# Patient Record
Sex: Female | Born: 2000 | Race: Black or African American | Hispanic: No | Marital: Single | Smoking: Never smoker
Health system: Southern US, Community
[De-identification: ages and names within clinical notes are randomized; demographics above are authoritative.]

## PROBLEM LIST (undated history)

## (undated) DIAGNOSIS — F419 Anxiety disorder, unspecified: Secondary | ICD-10-CM

## (undated) DIAGNOSIS — D573 Sickle-cell trait: Secondary | ICD-10-CM

## (undated) HISTORY — DX: Anxiety disorder, unspecified: F41.9

## (undated) HISTORY — DX: Sickle-cell trait: D57.3

## (undated) HISTORY — PX: CYST REMOVAL NECK: SHX6281

---

## 2020-03-16 ENCOUNTER — Emergency Department (HOSPITAL_COMMUNITY)
Admission: EM | Admit: 2020-03-16 | Discharge: 2020-03-16 | Disposition: A | Discharge status: Home / Self Care | Payer: Medicaid Other | Attending: Emergency Medicine | Admitting: Emergency Medicine

## 2020-03-16 ENCOUNTER — Encounter (HOSPITAL_COMMUNITY): Payer: Self-pay | Admitting: Emergency Medicine

## 2020-03-16 ENCOUNTER — Other Ambulatory Visit: Payer: Self-pay

## 2020-03-16 DIAGNOSIS — H9201 Otalgia, right ear: Secondary | ICD-10-CM | POA: Insufficient documentation

## 2020-03-16 DIAGNOSIS — Z5321 Procedure and treatment not carried out due to patient leaving prior to being seen by health care provider: Secondary | ICD-10-CM | POA: Diagnosis not present

## 2020-03-16 NOTE — ED Triage Notes (Signed)
C/o R ear throbbing since Tuesday.

## 2020-07-15 ENCOUNTER — Emergency Department (HOSPITAL_COMMUNITY)
Admission: EM | Admit: 2020-07-15 | Discharge: 2020-07-15 | Disposition: A | Discharge status: Home / Self Care | Payer: Medicaid Other | Attending: Emergency Medicine | Admitting: Emergency Medicine

## 2020-07-15 ENCOUNTER — Other Ambulatory Visit: Payer: Self-pay

## 2020-07-15 DIAGNOSIS — F410 Panic disorder [episodic paroxysmal anxiety] without agoraphobia: Secondary | ICD-10-CM | POA: Insufficient documentation

## 2020-07-15 DIAGNOSIS — F329 Major depressive disorder, single episode, unspecified: Secondary | ICD-10-CM | POA: Insufficient documentation

## 2020-07-15 DIAGNOSIS — R0789 Other chest pain: Secondary | ICD-10-CM | POA: Diagnosis not present

## 2020-07-15 DIAGNOSIS — R0602 Shortness of breath: Secondary | ICD-10-CM | POA: Diagnosis present

## 2020-07-15 MED ORDER — IBUPROFEN 200 MG PO TABS
600.0000 mg | ORAL_TABLET | Freq: Once | ORAL | Status: AC
  Administered 2020-07-15: 600 mg via ORAL
  Filled 2020-07-15: qty 3

## 2020-07-15 NOTE — Discharge Instructions (Signed)
Thank you for allowing me to care for you today in the Emergency Department.   Your symptoms today were consistent with a panic attack.  You can follow-up with [facility] if your symptoms return.  You can take 600 mg of ibuprofen once every 6 hours as needed for headache.  Make sure that you are eating and drinking while you are studying for exams to prevent dehydration.  Return to the emergency department if you develop respiratory distress, uncontrollable vomiting, if you pass out, develop new numbness or weakness, chest pain, or other new, concerning symptoms.

## 2020-07-15 NOTE — ED Provider Notes (Signed)
[facility]-EMERGENCY DEPT Provider Note   CSN: [id number] Arrival date & time: 07/15/20  0017     History Chief Complaint  Patient presents with  . Panic Attack    Tina Spence is a 20 y.o. female with a history of depression, anxiety, and ADHD who presents the emergency department by EMS from [facility] with a chief complaint of shortness of breath.  The patient reports that she was taking an online test from one of her classes when she suddenly developed shortness of breath and chest tightness.  She states that she could not catch her breath and began hyperventilating and crying.  Symptoms began less than 30 minutes prior to arrival.  EMS also noted that the patient was screaming when they arrived.  She was given 5 mg of IM midazolam in route and symptoms resolved.  Patient is concerned that she was having a panic attack.  She stated that prior to onset of symptoms that she was feeling in her baseline, but does note that she has been having headache for the last 2 days.  States that she is too nervous to try anything for her headache because her aunt died from pain medication.  She reports that she was diagnosed with depression, anxiety, and ADHD a little over a year ago and was taking medication, but states that she stopped the medication 1 month ago because she did not like the way it made her feel.  She denies cough, loss of sense of taste or smell, neck pain or stiffness, numbness, weakness, seizure-like activity, diaphoresis, dizziness, lightheadedness, vomiting, abdominal pain, diarrhea, constipation, nausea, visual changes, chest pain, or shortness of breath at this time.  No SI, HI, auditory visual hallucinations.  The history is provided by the patient and medical records. No language interpreter was used.       No past medical history on file.  There are no problems to display for this patient.   No past surgical history on file.   OB History   No  obstetric history on file.     No family history on file.  Social History   Tobacco Use  . Smoking status: Never Smoker  . Smokeless tobacco: Never Used  Substance Use Topics  . Alcohol use: Not Currently  . Drug use: Not Currently    Home Medications Prior to Admission medications   Medication Sig Start Date End Date Taking? Authorizing Provider  SIMPESSE 0.15-0.03 &0.01 MG tablet Take 1 tablet by mouth daily. 06/13/20  Yes [provider]    Allergies    Patient has no known allergies.  Review of Systems   Review of Systems  Constitutional: Negative for activity change, chills, diaphoresis and fever.       Crying  HENT: Negative for congestion, sore throat and trouble swallowing.   Respiratory: Positive for chest tightness and shortness of breath. Negative for apnea, cough, choking and wheezing.   Cardiovascular: Negative for chest pain, palpitations and leg swelling.  Gastrointestinal: Negative for abdominal pain, diarrhea, nausea and vomiting.  Genitourinary: Negative for dysuria, urgency, vaginal bleeding, vaginal discharge and vaginal pain.  Musculoskeletal: Negative for back pain, myalgias, neck pain and neck stiffness.  Skin: Negative for rash.  Allergic/Immunologic: Negative for immunocompromised state.  Neurological: Negative for dizziness, seizures, syncope, speech difficulty, weakness, numbness and headaches.  Psychiatric/Behavioral: Negative for confusion.    Physical Exam Updated Vital Signs BP 134/87 (BP Location: Right Arm)   Pulse 84   Temp 98.7 F (37.1  C) (Oral)   Ht 5\' 5"  (1.651 m)   Wt 81.6 kg   SpO2 98%   BMI 29.95 kg/m   Physical Exam Vitals and nursing note reviewed.  Constitutional:      General: She is not in acute distress.    Appearance: She is not ill-appearing, toxic-appearing or diaphoretic.     Comments: Well-appearing.  No acute distress.  HENT:     Head: Normocephalic.  Eyes:     Conjunctiva/sclera: Conjunctivae  normal.  Cardiovascular:     Rate and Rhythm: Normal rate and regular rhythm.     Heart sounds: No murmur heard. No friction rub. No gallop.   Pulmonary:     Effort: Pulmonary effort is normal. No respiratory distress.     Breath sounds: No stridor. No wheezing, rhonchi or rales.  Chest:     Chest wall: No tenderness.  Abdominal:     General: There is no distension.     Palpations: Abdomen is soft. There is no mass.     Tenderness: There is no abdominal tenderness. There is no right CVA tenderness, left CVA tenderness, guarding or rebound.     Hernia: No hernia is present.  Musculoskeletal:     Cervical back: Neck supple.     Right lower leg: No edema.     Left lower leg: No edema.  Skin:    General: Skin is warm.     Capillary Refill: Capillary refill takes less than 2 seconds.     Findings: No rash.  Neurological:     General: No focal deficit present.     Mental Status: She is alert.  Psychiatric:        Attention and Perception: She does not perceive auditory or visual hallucinations.        Mood and Affect: Mood is anxious.        Behavior: Behavior normal.        Thought Content: Thought content does not include homicidal or suicidal ideation. Thought content does not include homicidal or suicidal plan.     ED Results / Procedures / Treatments   Labs (all labs ordered are listed, but only abnormal results are displayed) Labs Reviewed - No data to display  EKG None  Radiology No results found.  Procedures Procedures   Medications Ordered in ED Medications  ibuprofen (ADVIL) tablet 600 mg (600 mg Oral Given 07/15/20 0159)    ED Course  I have reviewed the triage vital signs and the nursing notes.  Pertinent labs & imaging results that were available during my care of the patient were reviewed by me and considered in my medical decision making (see chart for details).    MDM Rules/Calculators/A&P                          20 year old female with history  of anxiety, depression, and ADHD who presents the emergency department by EMS from her dorm room with an episode of chest pain, shortness of breath, hyperventilating, and crying that began less than 30 minutes prior to arrival while the patient was taking a test.  She was given midazolam in route and symptoms have since resolved.  Her only complaint at this time is a headache, which resolved after she was given ibuprofen in the emergency department.  Patient was observed for more than 3 hours with no recurrence of symptoms.  She has remained hemodynamically stable in no acute distress.  EKG with normal  sinus rhythm.  Considered ACS, PE, aortic dissection, but strongly consider panic attack given her past medical history and onset of symptoms.  Patient was discussed with Dr. Nicanor Alcon, attending physician.  Will discharge patient to home with outpatient behavioral health services.  Referral to [facility] has been given.  ER return precautions given.  She is safe for discharge home with outpatient follow-up as indicated.  Final Clinical Impression(s) / ED Diagnoses Final diagnoses:  Panic attack    Rx / DC Orders ED Discharge Orders    None       Barkley Boards, PA-C 07/15/20 0903    Palumbo, April, MD 07/15/20 2306

## 2020-07-15 NOTE — ED Triage Notes (Signed)
Pt arrived via EMS from [facility]. Pt was having an panic attack, hyperventilating, screaming. Complains of headache X2 days. EMS gave 5 of Midazolam IM.

## 2020-11-21 ENCOUNTER — Telehealth: Payer: Self-pay | Admitting: Physician Assistant

## 2020-11-21 NOTE — Telephone Encounter (Signed)
Received a new hem referral from [facility] for iron deficiency. Ms. Schoenberg has been cld and scheduled to see Karena Addison on 7/1 at 11am. Awar to arrive 20 minutes early. 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

## 2020-11-28 NOTE — Progress Notes (Signed)
[facility] Telephone:([phone]   [phone]  INITIAL CONSULT NOTE  Patient Care Team: Pcp, No as PCP - General  Hematological/Oncological History 1) Labs from PCP, [name] from [facility] -08/15/2020: WBC 5.8, Hgb 11.6, MCV 73 (L), Plt 404 -09/18/2020: Iron 14 (L), TIBC 408 (H), Ferritin 3.50 (L). Hgb electrophoresis confirmed sickle cell trait (heterozygous).  -11/05/2020: WBC 5.5, Hgb 12.0, MCV 75 (L), Plt 353.  2) 11/29/2020: Establish care with [name] PA-C  CHIEF COMPLAINTS/PURPOSE OF CONSULTATION:  "Iron Deficiency Anemia "  HISTORY OF PRESENTING ILLNESS:  Tina Spence 20 y.o. female with medical history significant for anxiety and sickle cell trait. Patient is unaccompanied for this visit.   On exam today, patient reports chronic fatigue. She is able to complete all her ADLs but she requires frequent resting. She has a good appetite without any dietary restrictions. Patient denies any nausea, vomiting or abdominal pain. Her bowel movements are regular without any diarrhea or constipation. She denies easy bruising or signs of bleeding except for her menstrual bleeding. She notes having irregular menstrual cycles since switching her birth control earlier in the year. She now has a period sometimes twice a month. Her cycle usually lasts 5-6 days and it is heavy for 3-4 days. She requires changing her pad/tampon every 2-3 hours. She has craving for mash potatoes every day. Patient denies any fevers, chills, night sweats, shortness of breath, chest pain or cough. She has no other complaints. Rest of the 10 point ROS is below.   MEDICAL HISTORY:  Past Medical History:  Diagnosis Date   Anxiety    Sickle cell trait (HCC)     SURGICAL HISTORY: Past Surgical History:  Procedure Laterality Date   CYST REMOVAL NECK      SOCIAL HISTORY: Social History   Socioeconomic History   Marital status: Single    Spouse name: Not on file    Number of children: Not on file   Years of education: Not on file   Highest education level: Not on file  Occupational History   Not on file  Tobacco Use   Smoking status: Never   Smokeless tobacco: Never  Substance and Sexual Activity   Alcohol use: Not Currently   Drug use: Not Currently   Sexual activity: Not on file  Other Topics Concern   Not on file  Social History Narrative   Not on file   Social Determinants of Health   Financial Resource Strain: Not on file  Food Insecurity: Not on file  Transportation Needs: Not on file  Physical Activity: Not on file  Stress: Not on file  Social Connections: Not on file  Intimate Partner Violence: Not on file    FAMILY HISTORY: Family History  Problem Relation Age of Onset   Anemia Mother    Sickle cell trait Mother    Sickle cell trait Sister    Sickle cell anemia Other     ALLERGIES:  has No Known Allergies.  MEDICATIONS:  Current Outpatient Medications  Medication Sig Dispense Refill   drospirenone-ethinyl estradiol (YAZ) 3-0.02 MG tablet Take 1 tablet by mouth daily.     FEROSUL 325 (65 Fe) MG tablet Take 650 mg by mouth daily.     SIMPESSE 0.15-0.03 &0.01 MG tablet Take 1 tablet by mouth daily. (Patient not taking: Reported on 11/29/2020)     No current facility-administered medications for this visit.    REVIEW OF SYSTEMS:   Constitutional: ( - ) fevers, ( - )  chills , ( - ) night sweats Eyes: ( - ) blurriness of vision, ( - ) double vision, ( - ) watery eyes Ears, nose, mouth, throat, and face: ( - ) mucositis, ( - ) sore throat Respiratory: ( - ) cough, ( - ) dyspnea, ( - ) wheezes Cardiovascular: ( - ) palpitation, ( - ) chest discomfort, ( - ) lower extremity swelling Gastrointestinal:  ( - ) nausea, ( - ) heartburn, ( - ) change in bowel habits Skin: ( - ) abnormal skin rashes Lymphatics: ( - ) new lymphadenopathy, ( - ) easy bruising Neurological: ( - ) numbness, ( - ) tingling, ( - ) new  weaknesses Behavioral/Psych: ( - ) mood change, ( - ) new changes  All other systems were reviewed with the patient and are negative.  PHYSICAL EXAMINATION: ECOG PERFORMANCE STATUS: 1 - Symptomatic but completely ambulatory  Vitals:   11/29/20 1104  BP: 137/83  Pulse: 75  Resp: 18  Temp: 99 F (37.2 C)  SpO2: 100%   Filed Weights   11/29/20 1104  Weight: 200 lb 1.6 oz (90.8 kg)    GENERAL: well appearing African American female in NAD  SKIN: skin color, texture, turgor are normal, no rashes or significant lesions EYES: conjunctiva are pink and non-injected, sclera clear OROPHARYNX: no exudate, no erythema; lips, buccal mucosa, and tongue normal  NECK: supple, non-tender LYMPH:  no palpable lymphadenopathy in the cervical, axillary or supraclavicular lymph nodes.  LUNGS: clear to auscultation and percussion with normal breathing effort HEART: regular rate & rhythm and no murmurs and no lower extremity edema ABDOMEN: soft, non-tender, non-distended, normal bowel sounds Musculoskeletal: no cyanosis of digits and no clubbing  PSYCH: alert & oriented x 3, fluent speech NEURO: no focal motor/sensory deficits  LABORATORY DATA:  I have reviewed the data as listed CBC Latest Ref Rng & Units 11/29/2020  WBC 4.0 - 10.5 K/uL 4.6  Hemoglobin 12.0 - 15.0 g/dL 11.9(L)  Hematocrit 36.0 - 46.0 % 36.6  Platelets 150 - 400 K/uL 343    CMP Latest Ref Rng & Units 11/29/2020  Glucose 70 - 99 mg/dL 85  BUN 6 - 20 mg/dL 7  Creatinine 8.54 - 6.27 mg/dL 0.35  Sodium 009 - 381 mmol/L 141  Potassium 3.5 - 5.1 mmol/L 4.3  Chloride 98 - 111 mmol/L 106  CO2 22 - 32 mmol/L 25  Calcium 8.9 - 10.3 mg/dL 9.1  Total Protein 6.5 - 8.1 g/dL 7.2  Total Bilirubin 0.3 - 1.2 mg/dL 0.4  Alkaline Phos 38 - 126 U/L 68  AST 15 - 41 U/L 12(L)  ALT 0 - 44 U/L 8    ASSESSMENT & PLAN Tina Spence is a 20 y.o. female presenting to the clinic for evaluation for iron deficiency anemia. I reviewed most recent CBC  from 11/05/2020 that shows that hemoglobin level is within normal limits at 12. Patient will have microcytosis due to underlying sickle cell trait. The underlying cause of IDA is menstrual bleeding. She is currently taking ferrous sulfate 325 mg daily. We will proceed with labs today to check CBC, CMP, iron and TIBC, ferritin and retic panel. If there is evidence of anemia, we will consider IV iron infusions, otherwise, we recommend to continue on oral iron supplementation.   #Iron deficiency anemia 2/2 menstrual bleeding -Currently on ferrous sulfate 325 mg once daily. Recommend to take with source of vitamin C and not to take concurrently with antiacids or calcium.  -Provided list of  iron rich foods to incorporate into diet.  -Currently on birth control without improvement of menstrual bleeding. Advised to follow up with OB/GYN -Labs today to check CBC, CMP, iron and TIBC, ferritin and retic panel.  -Consider IV iron infusion if there is worsening anemia.  -RTC in 3 months with repeat labs.   Orders Placed This Encounter  Procedures   CBC with Differential (Cancer Center Only)    Standing Status:   Future    Number of Occurrences:   1    Standing Expiration Date:   11/28/2021   CMP (Cancer Center only)    Standing Status:   Future    Number of Occurrences:   1    Standing Expiration Date:   11/28/2021   Ferritin    Standing Status:   Future    Number of Occurrences:   1    Standing Expiration Date:   11/28/2021   Iron and TIBC    Standing Status:   Future    Number of Occurrences:   1    Standing Expiration Date:   11/28/2021   Retic Panel    Standing Status:   Future    Number of Occurrences:   1    Standing Expiration Date:   11/28/2021    All questions were answered. The patient knows to call the clinic with any problems, questions or concerns.  I have spent a total of 60 minutes minutes of face-to-face and non-face-to-face time, preparing to see the patient, obtaining and/or  reviewing separately obtained history, performing a medically appropriate examination, counseling and educating the patient, ordering tests, documenting clinical information in the electronic health record, and care coordination.   [name], PA-C Department of Hematology/Oncology [facility] at [facility] Phone: [phone]  Patient was seen with Dr. Leonides Schanz.   I have read the above note and personally examined the patient. I agree with the assessment and plan as noted above.  Briefly Mrs. Laing is a 20 year old female with medical history significant for sickle cell trait who presents for evaluation of iron deficiency anemia.  The patient is currently taking ferrous sulfate 325 mg p.o. daily and tolerating it well.  Today we will reassess her iron levels in order to determine if she needs additional supplementation with IV iron therapy.  She is following with OB/GYN in order to try to better control her heavy menstrual cycles.  We will plan to see her back in 3 months time.   Ulysees Barns, MD Department of Hematology/Oncology [facility] at [facility] Phone: [phone] Pager: [phone] Email: [email or web address]

## 2020-11-29 ENCOUNTER — Other Ambulatory Visit: Payer: Self-pay

## 2020-11-29 ENCOUNTER — Encounter: Payer: Self-pay | Admitting: Physician Assistant

## 2020-11-29 ENCOUNTER — Inpatient Hospital Stay: Payer: Medicaid Other

## 2020-11-29 ENCOUNTER — Inpatient Hospital Stay: Payer: Medicaid Other | Attending: Physician Assistant | Admitting: Physician Assistant

## 2020-11-29 VITALS — BP 137/83 | HR 75 | Temp 99.0°F | Resp 18 | Ht 65.0 in | Wt 200.1 lb

## 2020-11-29 DIAGNOSIS — Z79899 Other long term (current) drug therapy: Secondary | ICD-10-CM | POA: Insufficient documentation

## 2020-11-29 DIAGNOSIS — D509 Iron deficiency anemia, unspecified: Secondary | ICD-10-CM | POA: Insufficient documentation

## 2020-11-29 DIAGNOSIS — D573 Sickle-cell trait: Secondary | ICD-10-CM

## 2020-11-29 DIAGNOSIS — D508 Other iron deficiency anemias: Secondary | ICD-10-CM

## 2020-11-29 DIAGNOSIS — D500 Iron deficiency anemia secondary to blood loss (chronic): Secondary | ICD-10-CM

## 2020-11-29 LAB — CBC WITH DIFFERENTIAL (CANCER CENTER ONLY)
Abs Immature Granulocytes: 0.01 10*3/uL (ref 0.00–0.07)
Basophils Absolute: 0 10*3/uL (ref 0.0–0.1)
Basophils Relative: 0 %
Eosinophils Absolute: 0.1 10*3/uL (ref 0.0–0.5)
Eosinophils Relative: 1 %
HCT: 36.6 % (ref 36.0–46.0)
Hemoglobin: 11.9 g/dL — ABNORMAL LOW (ref 12.0–15.0)
Immature Granulocytes: 0 %
Lymphocytes Relative: 38 %
Lymphs Abs: 1.7 10*3/uL (ref 0.7–4.0)
MCH: 24.6 pg — ABNORMAL LOW (ref 26.0–34.0)
MCHC: 32.5 g/dL (ref 30.0–36.0)
MCV: 75.8 fL — ABNORMAL LOW (ref 80.0–100.0)
Monocytes Absolute: 0.4 10*3/uL (ref 0.1–1.0)
Monocytes Relative: 9 %
Neutro Abs: 2.4 10*3/uL (ref 1.7–7.7)
Neutrophils Relative %: 52 %
Platelet Count: 343 10*3/uL (ref 150–400)
RBC: 4.83 MIL/uL (ref 3.87–5.11)
RDW: 18.3 % — ABNORMAL HIGH (ref 11.5–15.5)
WBC Count: 4.6 10*3/uL (ref 4.0–10.5)
nRBC: 0 % (ref 0.0–0.2)

## 2020-11-29 LAB — CMP (CANCER CENTER ONLY)
ALT: 8 U/L (ref 0–44)
AST: 12 U/L — ABNORMAL LOW (ref 15–41)
Albumin: 3.9 g/dL (ref 3.5–5.0)
Alkaline Phosphatase: 68 U/L (ref 38–126)
Anion gap: 10 (ref 5–15)
BUN: 7 mg/dL (ref 6–20)
CO2: 25 mmol/L (ref 22–32)
Calcium: 9.1 mg/dL (ref 8.9–10.3)
Chloride: 106 mmol/L (ref 98–111)
Creatinine: 0.8 mg/dL (ref 0.44–1.00)
GFR, Estimated: >60 mL/min (ref >60)
Glucose, Bld: 85 mg/dL (ref 70–99)
Potassium: 4.3 mmol/L (ref 3.5–5.1)
Sodium: 141 mmol/L (ref 135–145)
Total Bilirubin: 0.4 mg/dL (ref 0.3–1.2)
Total Protein: 7.2 g/dL (ref 6.5–8.1)

## 2020-11-29 LAB — RETIC PANEL
Immature Retic Fract: 13.9 % (ref 2.3–15.9)
RBC.: 4.82 MIL/uL (ref 3.87–5.11)
Retic Count, Absolute: 47.7 10*3/uL (ref 19.0–186.0)
Retic Ct Pct: 1 % (ref 0.4–3.1)
Reticulocyte Hemoglobin: 30 pg (ref >27.9)

## 2020-11-29 LAB — IRON AND TIBC
Iron: 112 ug/dL (ref 41–142)
Saturation Ratios: 34 % (ref 21–57)
TIBC: 328 ug/dL (ref 236–444)
UIBC: 215 ug/dL (ref 120–384)

## 2020-11-29 LAB — FERRITIN: Ferritin: 12 ng/mL (ref 11–307)

## 2020-12-04 ENCOUNTER — Telehealth: Payer: Self-pay | Admitting: Physician Assistant

## 2020-12-04 NOTE — Telephone Encounter (Signed)
Scheduled per los. Called and left msg. Mailed printout  °

## 2020-12-17 ENCOUNTER — Encounter: Payer: Self-pay | Admitting: Obstetrics

## 2020-12-17 ENCOUNTER — Ambulatory Visit (INDEPENDENT_AMBULATORY_CARE_PROVIDER_SITE_OTHER): Payer: Medicaid Other | Admitting: Obstetrics

## 2020-12-17 ENCOUNTER — Other Ambulatory Visit: Payer: Self-pay

## 2020-12-17 VITALS — BP 118/82 | HR 77 | Ht 65.0 in | Wt 203.0 lb

## 2020-12-17 DIAGNOSIS — D508 Other iron deficiency anemias: Secondary | ICD-10-CM | POA: Diagnosis not present

## 2020-12-17 DIAGNOSIS — N939 Abnormal uterine and vaginal bleeding, unspecified: Secondary | ICD-10-CM

## 2020-12-17 DIAGNOSIS — Z01419 Encounter for gynecological examination (general) (routine) without abnormal findings: Secondary | ICD-10-CM

## 2020-12-17 DIAGNOSIS — N946 Dysmenorrhea, unspecified: Secondary | ICD-10-CM | POA: Diagnosis not present

## 2020-12-17 MED ORDER — IBUPROFEN 800 MG PO TABS
800.0000 mg | ORAL_TABLET | Freq: Three times a day (TID) | ORAL | 5 refills | Status: DC | PRN
Start: 2020-12-17 — End: 2022-05-01

## 2020-12-17 MED ORDER — VITAFOL ULTRA 29-0.6-0.4-200 MG PO CAPS
1.0000 | ORAL_CAPSULE | Freq: Every day | ORAL | 4 refills | Status: DC
Start: 2020-12-17 — End: 2021-05-28

## 2020-12-17 NOTE — Progress Notes (Signed)
NGYN patient presents for visit to establish care and discuss heavy cycles/Iron deficiency per referral note. Referred to hematology for IV iron but  was told to F/U with OBGYN first .  LMP:12/01/20 lasting 7 days , changing every 3-4 hrs a heavy plus pad associated with clots.pt notes bad cramps takes OTC Rx for pain. Contraception:Pills. Recently started in May.  Pt has never been sexually active.

## 2020-12-17 NOTE — Progress Notes (Signed)
Subjective:        Tina Spence is a 20 y.o. female here for a routine exam.  Current complaints: Heavy, irregular and painful periods.  Recently placed on OCP's ( Yaz ).  Personal health questionnaire:  Is patient Ashkenazi Jewish, have a family history of breast and/or ovarian cancer: no Is there a family history of uterine cancer diagnosed at age < 48, gastrointestinal cancer, urinary tract cancer, family member who is a Personnel officer syndrome-associated carrier: no Is the patient overweight and hypertensive, family history of diabetes, personal history of gestational diabetes, preeclampsia or PCOS: no Is patient over 68, have PCOS,  family history of premature CHD under age 96, diabetes, smoke, have hypertension or peripheral artery disease:  no At any time, has a partner hit, kicked or otherwise hurt or frightened you?: no Over the past 2 weeks, have you felt down, depressed or hopeless?: no Over the past 2 weeks, have you felt little interest or pleasure in doing things?:no   Gynecologic History Patient's last menstrual period was 12/01/2020. Contraception: OCP (estrogen/progesterone) Last Pap: n/a. Results were: n/a Last mammogram: n/a. Results were: n/a  Obstetric History OB History  No obstetric history on file.    Past Medical History:  Diagnosis Date   Anxiety    Sickle cell trait (HCC)     Past Surgical History:  Procedure Laterality Date   CYST REMOVAL NECK       Current Outpatient Medications:    drospirenone-ethinyl estradiol (YAZ) 3-0.02 MG tablet, Take 1 tablet by mouth daily., Disp: , Rfl:    FEROSUL 325 (65 Fe) MG tablet, Take 650 mg by mouth daily., Disp: , Rfl:    ibuprofen (ADVIL) 800 MG tablet, Take 1 tablet (800 mg total) by mouth every 8 (eight) hours as needed., Disp: 30 tablet, Rfl: 5   Prenat-Fe Poly-Methfol-FA-DHA (VITAFOL ULTRA) 29-0.6-0.4-200 MG CAPS, Take 1 capsule by mouth daily before breakfast., Disp: 90 capsule, Rfl: 4   SIMPESSE 0.15-0.03  &0.01 MG tablet, Take 1 tablet by mouth daily. (Patient not taking: No sig reported), Disp: , Rfl:  No Known Allergies  Social History   Tobacco Use   Smoking status: Never   Smokeless tobacco: Never  Substance Use Topics   Alcohol use: Not Currently    Family History  Problem Relation Age of Onset   Anemia Mother    Sickle cell trait Mother    Sickle cell trait Sister    Sickle cell anemia Other       Review of Systems  Constitutional: negative for fatigue and weight loss Respiratory: negative for cough and wheezing Cardiovascular: negative for chest pain, fatigue and palpitations Gastrointestinal: negative for abdominal pain and change in bowel habits Musculoskeletal:negative for myalgias Neurological: negative for gait problems and tremors Behavioral/Psych: negative for abusive relationship, depression Endocrine: negative for temperature intolerance    Genitourinary:positive for abnormal menstrual periods.  Negative for genital lesions, hot flashes, sexual problems and vaginal discharge Integument/breast: negative for breast lump, breast tenderness, nipple discharge and skin lesion(s)    Objective:       BP 118/82   Pulse 77   Ht 5\' 5"  (1.651 m)   Wt 203 lb (92.1 kg)   LMP 12/01/2020   BMI 33.78 kg/m  General:   Alert and no distress  Skin:   no rash or abnormalities  Lungs:   clear to auscultation bilaterally  Heart:   regular rate and rhythm, S1, S2 normal, no murmur, click, rub or gallop  Breasts:   normal without suspicious masses, skin or nipple changes or axillary nodes  Abdomen:  normal findings: no organomegaly, soft, non-tender and no hernia  Pelvis:  External genitalia: normal general appearance Urinary system: urethral meatus normal and bladder without fullness, nontender Vaginal: normal without tenderness, induration or masses Cervix: normal appearance Adnexa: normal bimanual exam Uterus: anteverted and non-tender, normal size   Lab Review Urine  pregnancy test Labs reviewed n/a Radiologic studies reviewed  no  Assessment:    1. Encounter for routine gynecological examination with Papanicolaou smear of cervix  2. Dysmenorrhea RX; - ibuprofen (ADVIL) 800 MG tablet; Take 1 tablet (800 mg total) by mouth every 8 (eight) hours as needed.  Dispense: 30 tablet; Refill: 5  3. Iron deficiency anemia secondary to inadequate dietary iron intake Rx: - Prenat-Fe Poly-Methfol-FA-DHA (VITAFOL ULTRA) 29-0.6-0.4-200 MG CAPS; Take 1 capsule by mouth daily before breakfast.  Dispense: 90 capsule; Refill: 4  4. Abnormal uterine bleeding (AUB) - needs OCP regulation of cycles     Plan:    Education reviewed: calcium supplements, depression evaluation, low fat, low cholesterol diet, safe sex/STD prevention, self breast exams, and weight bearing exercise. Contraception: OCP (estrogen/progesterone). Follow up in: 4 months.  OCP Surveillance   Meds ordered this encounter  Medications   Prenat-Fe Poly-Methfol-FA-DHA (VITAFOL ULTRA) 29-0.6-0.4-200 MG CAPS    Sig: Take 1 capsule by mouth daily before breakfast.    Dispense:  90 capsule    Refill:  4   ibuprofen (ADVIL) 800 MG tablet    Sig: Take 1 tablet (800 mg total) by mouth every 8 (eight) hours as needed.    Dispense:  30 tablet    Refill:  5     Brock Bad, MD 12/17/2020 12:02 PM

## 2021-01-01 ENCOUNTER — Emergency Department (HOSPITAL_COMMUNITY): Payer: Medicaid Other

## 2021-01-01 ENCOUNTER — Other Ambulatory Visit: Payer: Self-pay

## 2021-01-01 ENCOUNTER — Emergency Department (HOSPITAL_COMMUNITY)
Admission: EM | Admit: 2021-01-01 | Discharge: 2021-01-01 | Disposition: A | Discharge status: Home / Self Care | Payer: Medicaid Other | Attending: Emergency Medicine | Admitting: Emergency Medicine

## 2021-01-01 ENCOUNTER — Encounter (HOSPITAL_COMMUNITY): Payer: Self-pay

## 2021-01-01 DIAGNOSIS — U071 COVID-19: Secondary | ICD-10-CM | POA: Diagnosis not present

## 2021-01-01 DIAGNOSIS — Z28310 Unvaccinated for covid-19: Secondary | ICD-10-CM | POA: Diagnosis not present

## 2021-01-01 DIAGNOSIS — R0602 Shortness of breath: Secondary | ICD-10-CM

## 2021-01-01 DIAGNOSIS — N9489 Other specified conditions associated with female genital organs and menstrual cycle: Secondary | ICD-10-CM | POA: Insufficient documentation

## 2021-01-01 LAB — CBC WITH DIFFERENTIAL/PLATELET
Abs Immature Granulocytes: 0 10*3/uL (ref 0.00–0.07)
Basophils Absolute: 0 10*3/uL (ref 0.0–0.1)
Basophils Relative: 1 %
Eosinophils Absolute: 0.2 10*3/uL (ref 0.0–0.5)
Eosinophils Relative: 5 %
HCT: 37.7 % (ref 36.0–46.0)
Hemoglobin: 12.2 g/dL (ref 12.0–15.0)
Immature Granulocytes: 0 %
Lymphocytes Relative: 44 %
Lymphs Abs: 1.8 10*3/uL (ref 0.7–4.0)
MCH: 24.9 pg — ABNORMAL LOW (ref 26.0–34.0)
MCHC: 32.4 g/dL (ref 30.0–36.0)
MCV: 77.1 fL — ABNORMAL LOW (ref 80.0–100.0)
Monocytes Absolute: 0.7 10*3/uL (ref 0.1–1.0)
Monocytes Relative: 17 %
Neutro Abs: 1.3 10*3/uL — ABNORMAL LOW (ref 1.7–7.7)
Neutrophils Relative %: 33 %
Platelets: 323 10*3/uL (ref 150–400)
RBC: 4.89 MIL/uL (ref 3.87–5.11)
RDW: 16.5 % — ABNORMAL HIGH (ref 11.5–15.5)
WBC: 3.9 10*3/uL — ABNORMAL LOW (ref 4.0–10.5)
nRBC: 0 % (ref 0.0–0.2)

## 2021-01-01 LAB — COMPREHENSIVE METABOLIC PANEL
ALT: 14 U/L (ref 0–44)
AST: 16 U/L (ref 15–41)
Albumin: 4.1 g/dL (ref 3.5–5.0)
Alkaline Phosphatase: 60 U/L (ref 38–126)
Anion gap: 10 (ref 5–15)
BUN: 7 mg/dL (ref 6–20)
CO2: 27 mmol/L (ref 22–32)
Calcium: 9.3 mg/dL (ref 8.9–10.3)
Chloride: 106 mmol/L (ref 98–111)
Creatinine, Ser: 0.74 mg/dL (ref 0.44–1.00)
GFR, Estimated: >60 mL/min (ref >60)
Glucose, Bld: 96 mg/dL (ref 70–99)
Potassium: 4 mmol/L (ref 3.5–5.1)
Sodium: 143 mmol/L (ref 135–145)
Total Bilirubin: 0.3 mg/dL (ref 0.3–1.2)
Total Protein: 7.5 g/dL (ref 6.5–8.1)

## 2021-01-01 LAB — I-STAT BETA HCG BLOOD, ED (MC, WL, AP ONLY): I-stat hCG, quantitative: <5 m[IU]/mL (ref <5)

## 2021-01-01 LAB — RESP PANEL BY RT-PCR (FLU A&B, COVID) ARPGX2
Influenza A by PCR: NEGATIVE
Influenza B by PCR: NEGATIVE
SARS Coronavirus 2 by RT PCR: POSITIVE — AB

## 2021-01-01 LAB — D-DIMER, QUANTITATIVE: D-Dimer, Quant: 0.47 ug/mL-FEU (ref 0.00–0.50)

## 2021-01-01 NOTE — Discharge Instructions (Addendum)
Make sure to isolate for the next 5 days at home per [facility] guidance. Treat this as any other viral illness, but with a low threshold to return to the ED if your breathing worsens.

## 2021-01-01 NOTE — ED Triage Notes (Signed)
Pt reports trouble breathing since yesterday with chest tightness. Pt also endorses cough and congestion.

## 2021-01-01 NOTE — ED Notes (Signed)
Patient transported to X-ray 

## 2021-01-01 NOTE — ED Provider Notes (Signed)
[facility]-EMERGENCY DEPT Provider Note   CSN: [id number] Arrival date & time: 01/01/21  3235     History Chief Complaint  Patient presents with   Shortness of Breath    Tina Spence is a 20 y.o. female.   Shortness of Breath Severity:  Moderate Onset quality:  Sudden Duration:  1 day Timing:  Constant Progression:  Worsening Chronicity:  New Context: URI   Relieved by:  Nothing Ineffective treatments:  None tried Associated symptoms: cough and sore throat   Associated symptoms: no abdominal pain, no chest pain, no fever, no headaches, no hemoptysis, no rash, no sputum production and no wheezing   Risk factors: no hx of PE/DVT    20 year old female with a history of anxiety and sickle cell trait presents to the emergency department with sudden onset shortness of breath that started yesterday evening.  The patient also endorses symptoms consistent with a viral syndrome with sore throat and nasal congestion.  She endorses a nonproductive cough for the past 24 hours.  She is on estrogen-containing birth control pills.  She denies any leg swelling or leg pain.  She denies any history of PE or DVT.  Denies any chest pain currently.  She does endorse some chest tightness.  She endorses a nonproductive cough.  No hemoptysis.  She endorses chills but no fevers.  She states that she is incompletely vaccinated for COVID-19.  She denies any sick contacts.  Past Medical History:  Diagnosis Date   Anxiety    Sickle cell trait [facility])     Patient Active Problem List   Diagnosis Date Noted   IDA (iron deficiency anemia) 11/29/2020    Past Surgical History:  Procedure Laterality Date   CYST REMOVAL NECK       OB History   No obstetric history on file.     Family History  Problem Relation Age of Onset   Anemia Mother    Sickle cell trait Mother    Sickle cell trait Sister    Sickle cell anemia Other     Social History   Tobacco Use   Smoking status:  Never   Smokeless tobacco: Never  Vaping Use   Vaping Use: Never used  Substance Use Topics   Alcohol use: Not Currently   Drug use: Not Currently    Home Medications Prior to Admission medications   Medication Sig Start Date End Date Taking? Authorizing Provider  butalbital-acetaminophen-caffeine (FIORICET) 50-325-40 MG tablet Take 1 tablet by mouth 2 (two) times daily as needed for headache or migraine.   Yes [provider]  drospirenone-ethinyl estradiol (YAZ) 3-0.02 MG tablet Take 1 tablet by mouth daily. 10/04/20  Yes [provider]  FEROSUL 325 (65 Fe) MG tablet Take 325 mg by mouth every other day. 10/07/20  Yes [provider]  ibuprofen (ADVIL) 800 MG tablet Take 1 tablet (800 mg total) by mouth every 8 (eight) hours as needed. Patient taking differently: Take 800 mg by mouth every 8 (eight) hours as needed for cramping or mild pain. 12/17/20  Yes Brock Bad, MD  oxymetazoline [facility] SINUS-MAX CLEAR & COOL) 0.05 % nasal spray Place 1 spray into both nostrils every 4 (four) hours as needed for congestion.   Yes [provider]  Prenat-Fe Poly-Methfol-FA-DHA (VITAFOL ULTRA) 29-0.6-0.4-200 MG CAPS Take 1 capsule by mouth daily before breakfast. 12/17/20  Yes Brock Bad, MD  Pseudoephedrine-APAP-DM (DAYQUIL PO) Take 30 mLs by mouth every 4 (four) hours as needed (  congestion/cold).   Yes [provider]    Allergies    Patient has no known allergies.  Review of Systems   Review of Systems  Constitutional:  Negative for fever.  HENT:  Positive for sore throat.   Respiratory:  Positive for cough and shortness of breath. Negative for hemoptysis, sputum production and wheezing.   Cardiovascular:  Negative for chest pain.  Gastrointestinal:  Negative for abdominal pain.  Skin:  Negative for rash.  Neurological:  Negative for headaches.  All other systems reviewed and are negative.  Physical Exam Updated Vital Signs BP (!)  141/97   Pulse 82   Temp 98.8 F (37.1 C) (Oral)   Resp 13   SpO2 100%   Physical Exam Vitals and nursing note reviewed.  Constitutional:      General: She is not in acute distress.    Appearance: She is well-developed.  HENT:     Head: Normocephalic and atraumatic.  Eyes:     Conjunctiva/sclera: Conjunctivae normal.  Cardiovascular:     Rate and Rhythm: Normal rate and regular rhythm.     Heart sounds: No murmur heard. Pulmonary:     Effort: Pulmonary effort is normal. No tachypnea or respiratory distress.     Breath sounds: Normal breath sounds. No wheezing, rhonchi or rales.  Abdominal:     Palpations: Abdomen is soft.     Tenderness: There is no abdominal tenderness.  Musculoskeletal:     Cervical back: Neck supple.     Right lower leg: No tenderness. No edema.     Left lower leg: No tenderness. No edema.  Skin:    General: Skin is warm and dry.  Neurological:     General: No focal deficit present.     Mental Status: She is alert and oriented to person, place, and time.    ED Results / Procedures / Treatments   Labs (all labs ordered are listed, but only abnormal results are displayed) Labs Reviewed  RESP PANEL BY RT-PCR (FLU A&B, COVID) ARPGX2 - Abnormal; Notable for the following components:      Result Value   SARS Coronavirus 2 by RT PCR POSITIVE (*)    All other components within normal limits  CBC WITH DIFFERENTIAL/PLATELET - Abnormal; Notable for the following components:   WBC 3.9 (*)    MCV 77.1 (*)    MCH 24.9 (*)    RDW 16.5 (*)    Neutro Abs 1.3 (*)    All other components within normal limits  COMPREHENSIVE METABOLIC PANEL  D-DIMER, QUANTITATIVE  I-STAT BETA HCG BLOOD, ED ([facility], [facility], [facility] ONLY)    EKG EKG Interpretation  Date/Time:  Wednesday January 01 2021 08:39:13 EDT Ventricular Rate:  90 PR Interval:  170 QRS Duration: 92 QT Interval:  366 QTC Calculation: 448 R Axis:   95 Text Interpretation: Sinus rhythm Borderline right axis  deviation Low voltage, precordial leads 12 Lead; Mason-Likar Confirmed by Ernie Avena ([id number]) on 01/01/2021 8:52:21 AM  Radiology DG Chest 2 View  Result Date: 01/01/2021 CLINICAL DATA:  Shortness of breath, cough, congestion EXAM: CHEST - 2 VIEW COMPARISON:  None. FINDINGS: The heart size and mediastinal contours are within normal limits. Both lungs are clear. The visualized skeletal structures are unremarkable. IMPRESSION: Normal study. Electronically Signed   By: Charlett Nose M.D.   On: 01/01/2021 09:00    Procedures Procedures   Medications Ordered in ED Medications - No data to display  ED Course  I have reviewed  the triage vital signs and the nursing notes.  Pertinent labs & imaging results that were available during my care of the patient were reviewed by me and considered in my medical decision making (see chart for details).    MDM Rules/Calculators/A&P                           20 year old female with past medical history as above presenting to the emergency department with acute onset shortness of breath, sore throat, nonproductive cough, chills for the past day.  No sick contacts or COVID-19 exposures that she is aware.  She is incompletely vaccinated for COVID.  She does take estrogen containing OCPs.  No history of DVT or PE.  Differential diagnosis includes viral syndrome, COVID-19, bacterial pneumonia, less likely PE.  PERC positive given the patient's history of OCP use containing estrogen.  Will obtain D-dimer given the low pretest probability for PE.  D-dimer resulted normal.  The patient is afebrile, overall well-appearing, without desaturations noted on ambulation in the emergency department.  She does not have an oxygen requirement.  Chest x-ray negative for pneumonia.  Labs otherwise significant for mild leukopenia.  COVID-19 PCR testing ultimately resulted positive for COVID-19.  The patient was advised to isolate at home for 5 days for per [facility] guidance.  A work note  was provided.  Supportive care measures were advised and the patient was given strict return precautions in the event of worsening dyspnea.  Final Clinical Impression(s) / ED Diagnoses Final diagnoses:  COVID-19  Shortness of breath    Rx / DC Orders ED Discharge Orders     None        Ernie Avena, MD 01/01/21 1026

## 2021-01-15 ENCOUNTER — Encounter: Payer: Self-pay | Admitting: Obstetrics

## 2021-01-15 ENCOUNTER — Other Ambulatory Visit: Payer: Self-pay

## 2021-01-15 ENCOUNTER — Ambulatory Visit (INDEPENDENT_AMBULATORY_CARE_PROVIDER_SITE_OTHER): Payer: Medicaid Other | Admitting: Obstetrics

## 2021-01-15 ENCOUNTER — Other Ambulatory Visit (HOSPITAL_COMMUNITY)
Admission: RE | Admit: 2021-01-15 | Discharge: 2021-01-15 | Disposition: A | Discharge status: Home / Self Care | Payer: Medicaid Other | Source: Ambulatory Visit | Attending: Obstetrics | Admitting: Obstetrics

## 2021-01-15 VITALS — Wt 201.3 lb

## 2021-01-15 DIAGNOSIS — N898 Other specified noninflammatory disorders of vagina: Secondary | ICD-10-CM

## 2021-01-15 DIAGNOSIS — N939 Abnormal uterine and vaginal bleeding, unspecified: Secondary | ICD-10-CM | POA: Diagnosis not present

## 2021-01-15 NOTE — Progress Notes (Signed)
Patient ID: Tina Spence, female   DOB: [birth year], 20 y.o.   MRN: [id number]  Chief Complaint  Patient presents with   Vaginal Bleeding    HPI Tina Spence is a 20 y.o. female.  Complains of vaginal discharge and 2 periods this past month.  Contraception: Yaz.  Starting taking in June 2022. HPI  Past Medical History:  Diagnosis Date   Anxiety    Sickle cell trait (HCC)     Past Surgical History:  Procedure Laterality Date   CYST REMOVAL NECK      Family History  Problem Relation Age of Onset   Anemia Mother    Sickle cell trait Mother    Sickle cell trait Sister    Sickle cell anemia Other     Social History Social History   Tobacco Use   Smoking status: Never   Smokeless tobacco: Never  Vaping Use   Vaping Use: Never used  Substance Use Topics   Alcohol use: Not Currently   Drug use: Not Currently    No Known Allergies  Current Outpatient Medications  Medication Sig Dispense Refill   drospirenone-ethinyl estradiol (YAZ) 3-0.02 MG tablet Take 1 tablet by mouth daily.     FEROSUL 325 (65 Fe) MG tablet Take 325 mg by mouth every other day.     Prenat-Fe Poly-Methfol-FA-DHA (VITAFOL ULTRA) 29-0.6-0.4-200 MG CAPS Take 1 capsule by mouth daily before breakfast. 90 capsule 4   butalbital-acetaminophen-caffeine (FIORICET) 50-325-40 MG tablet Take 1 tablet by mouth 2 (two) times daily as needed for headache or migraine. (Patient not taking: Reported on 01/15/2021)     ibuprofen (ADVIL) 800 MG tablet Take 1 tablet (800 mg total) by mouth every 8 (eight) hours as needed. (Patient not taking: Reported on 01/15/2021) 30 tablet 5   oxymetazoline (MUCINEX SINUS-MAX CLEAR & COOL) 0.05 % nasal spray Place 1 spray into both nostrils every 4 (four) hours as needed for congestion.     Pseudoephedrine-APAP-DM (DAYQUIL PO) Take 30 mLs by mouth every 4 (four) hours as needed (congestion/cold).     No current facility-administered medications for this visit.    Review of  Systems Review of Systems Constitutional: negative for fatigue and weight loss Respiratory: negative for cough and wheezing Cardiovascular: negative for chest pain, fatigue and palpitations Gastrointestinal: negative for abdominal pain and change in bowel habits Genitourinary:positive for AUB and vaginal discharge Integument/breast: negative for nipple discharge Musculoskeletal:negative for myalgias Neurological: negative for gait problems and tremors Behavioral/Psych: negative for abusive relationship, depression Endocrine: negative for temperature intolerance      Weight 201 lb 4.8 oz (91.3 kg), last menstrual period 01/12/2021.  Physical Exam Physical Exam General:   Alert and no distress  Skin:   no rash or abnormalities  Lungs:   clear to auscultation bilaterally  Heart:   regular rate and rhythm, S1, S2 normal, no murmur, click, rub or gallop  Breasts:   normal without suspicious masses, skin or nipple changes or axillary nodes  Abdomen:  normal findings: no organomegaly, soft, non-tender and no hernia  Pelvis:  External genitalia: normal general appearance Urinary system: urethral meatus normal and bladder without fullness, nontender Vaginal: normal without tenderness, induration or masses Cervix: normal appearance Adnexa: normal bimanual exam Uterus: anteverted and non-tender, normal size    I have spent a total of 20 minutes of face-to-face time, excluding clinical staff time, reviewing notes and preparing to see patient, ordering tests and/or medications, and counseling the patient.   Data Reviewed Labs Wet Prep  Assessment     1. Vaginal discharge Rx: - Cervicovaginal ancillary only( [place])  2. Abnormal uterine bleeding (AUB) - O - continue Yaz     Plan   Follow up in 4 months    Brock Bad, MD 01/15/2021 4:58 PM

## 2021-01-15 NOTE — Progress Notes (Signed)
Pt presents for AUB. Pt c/o having cycles twice a month.  Pt has not had sex before

## 2021-01-17 LAB — CERVICOVAGINAL ANCILLARY ONLY
Bacterial Vaginitis (gardnerella): POSITIVE — AB
Candida Glabrata: NEGATIVE
Candida Vaginitis: NEGATIVE
Chlamydia: NEGATIVE
Comment: NEGATIVE
Comment: NEGATIVE
Comment: NEGATIVE
Comment: NEGATIVE
Comment: NEGATIVE
Comment: NORMAL
Neisseria Gonorrhea: NEGATIVE
Trichomonas: NEGATIVE

## 2021-03-04 ENCOUNTER — Inpatient Hospital Stay: Payer: Medicaid Other

## 2021-03-04 ENCOUNTER — Other Ambulatory Visit: Payer: Self-pay | Admitting: Physician Assistant

## 2021-03-04 ENCOUNTER — Telehealth: Payer: Self-pay

## 2021-03-04 ENCOUNTER — Inpatient Hospital Stay: Payer: Medicaid Other | Attending: Physician Assistant | Admitting: Physician Assistant

## 2021-03-04 ENCOUNTER — Other Ambulatory Visit: Payer: Self-pay

## 2021-03-04 DIAGNOSIS — D573 Sickle-cell trait: Secondary | ICD-10-CM | POA: Diagnosis not present

## 2021-03-04 DIAGNOSIS — E611 Iron deficiency: Secondary | ICD-10-CM | POA: Insufficient documentation

## 2021-03-04 DIAGNOSIS — N920 Excessive and frequent menstruation with regular cycle: Secondary | ICD-10-CM | POA: Diagnosis present

## 2021-03-04 DIAGNOSIS — D500 Iron deficiency anemia secondary to blood loss (chronic): Secondary | ICD-10-CM

## 2021-03-04 LAB — CBC WITH DIFFERENTIAL (CANCER CENTER ONLY)
Abs Immature Granulocytes: 0.01 10*3/uL (ref 0.00–0.07)
Basophils Absolute: 0 10*3/uL (ref 0.0–0.1)
Basophils Relative: 1 %
Eosinophils Absolute: 0.1 10*3/uL (ref 0.0–0.5)
Eosinophils Relative: 1 %
HCT: 38.6 % (ref 36.0–46.0)
Hemoglobin: 12.4 g/dL (ref 12.0–15.0)
Immature Granulocytes: 0 %
Lymphocytes Relative: 29 %
Lymphs Abs: 1.6 10*3/uL (ref 0.7–4.0)
MCH: 24.4 pg — ABNORMAL LOW (ref 26.0–34.0)
MCHC: 32.1 g/dL (ref 30.0–36.0)
MCV: 76 fL — ABNORMAL LOW (ref 80.0–100.0)
Monocytes Absolute: 0.5 10*3/uL (ref 0.1–1.0)
Monocytes Relative: 10 %
Neutro Abs: 3.3 10*3/uL (ref 1.7–7.7)
Neutrophils Relative %: 59 %
Platelet Count: 368 10*3/uL (ref 150–400)
RBC: 5.08 MIL/uL (ref 3.87–5.11)
RDW: 15 % (ref 11.5–15.5)
WBC Count: 5.5 10*3/uL (ref 4.0–10.5)
nRBC: 0 % (ref 0.0–0.2)

## 2021-03-04 LAB — IRON AND TIBC
Iron: 51 ug/dL (ref 41–142)
Saturation Ratios: 13 % — ABNORMAL LOW (ref 21–57)
TIBC: 409 ug/dL (ref 236–444)
UIBC: 357 ug/dL (ref 120–384)

## 2021-03-04 LAB — FERRITIN: Ferritin: 8 ng/mL — ABNORMAL LOW (ref 11–307)

## 2021-03-04 NOTE — Progress Notes (Signed)
[facility] [phone]   [phone]  PROGRESS NOTE  Patient Care Team: Pcp, No as PCP - General  Hematological/Oncological History 1) Labs from PCP, [name] from [facility] -08/15/2020: WBC 5.8, Hgb 11.6, MCV 73 (L), Plt 404 -09/18/2020: Iron 14 (L), TIBC 408 (H), Ferritin 3.50 (L). Hgb electrophoresis confirmed sickle cell trait (heterozygous).  -11/05/2020: WBC 5.5, Hgb 12.0, MCV 75 (L), Plt 353.  2) 11/29/2020: Establish care with [name] PA-C   CHIEF COMPLAINTS/PURPOSE OF CONSULTATION:  "Iron Deficiency without anemia "  HISTORY OF PRESENTING ILLNESS:  Tina Spence 20 y.o. female returns for a follow up for iron deficiency without anemia.   On exam today, patient reports that her energy levels improved but declined again two weeks ago. Since then, she continues to feel exhausted and requires frequent resting.  Her appetite is unchanged and she denies any weight changes. Patient denies any nausea, vomiting or abdominal pain. Her bowel movements are regular without any diarrhea or constipation. She denies easy bruising or signs of bleeding except for her menstrual bleeding. She started birth control in June 2022.Marland Kitchen Since then, her menstrual flow has become lighter but now she has more frequent cycles, twice a month. She is compliant with taking ferrous sulfate with good tolerance. Patient denies any fevers, chills, night sweats, shortness of breath, chest pain or cough. She has no other complaints. Rest of the 10 point ROS is below.   MEDICAL HISTORY:  Past Medical History:  Diagnosis Date   Anxiety    Sickle cell trait (HCC)     SURGICAL HISTORY: Past Surgical History:  Procedure Laterality Date   CYST REMOVAL NECK      SOCIAL HISTORY: Social History   Socioeconomic History   Marital status: Single    Spouse name: Not on file   Number of children: Not on file   Years of education: Not on file   Highest education  level: Not on file  Occupational History   Not on file  Tobacco Use   Smoking status: Never   Smokeless tobacco: Never  Vaping Use   Vaping Use: Never used  Substance and Sexual Activity   Alcohol use: Not Currently   Drug use: Not Currently   Sexual activity: Never    Birth control/protection: Pill  Other Topics Concern   Not on file  Social History Narrative   Not on file   Social Determinants of Health   Financial Resource Strain: Not on file  Food Insecurity: Not on file  Transportation Needs: Not on file  Physical Activity: Not on file  Stress: Not on file  Social Connections: Not on file  Intimate Partner Violence: Not on file    FAMILY HISTORY: Family History  Problem Relation Age of Onset   Anemia Mother    Sickle cell trait Mother    Sickle cell trait Sister    Sickle cell anemia Other     ALLERGIES:  has No Known Allergies.  MEDICATIONS:  Current Outpatient Medications  Medication Sig Dispense Refill   butalbital-acetaminophen-caffeine (FIORICET) 50-325-40 MG tablet Take 1 tablet by mouth 2 (two) times daily as needed for headache or migraine.     drospirenone-ethinyl estradiol (YAZ) 3-0.02 MG tablet Take 1 tablet by mouth daily.     FEROSUL 325 (65 Fe) MG tablet Take 325 mg by mouth every other day.     Prenat-Fe Poly-Methfol-FA-DHA (VITAFOL ULTRA) 29-0.6-0.4-200 MG CAPS Take 1 capsule by mouth daily before breakfast. 90  capsule 4   ibuprofen (ADVIL) 800 MG tablet Take 1 tablet (800 mg total) by mouth every 8 (eight) hours as needed. (Patient not taking: Reported on 03/04/2021) 30 tablet 5   No current facility-administered medications for this visit.    REVIEW OF SYSTEMS:   Constitutional: ( - ) fevers, ( - )  chills , ( - ) night sweats Eyes: ( - ) blurriness of vision, ( - ) double vision, ( - ) watery eyes Ears, nose, mouth, throat, and face: ( - ) mucositis, ( - ) sore throat Respiratory: ( - ) cough, ( - ) dyspnea, ( - ) wheezes Cardiovascular:  ( - ) palpitation, ( - ) chest discomfort, ( - ) lower extremity swelling Gastrointestinal:  ( - ) nausea, ( - ) heartburn, ( - ) change in bowel habits Skin: ( - ) abnormal skin rashes Lymphatics: ( - ) new lymphadenopathy, ( - ) easy bruising Neurological: ( - ) numbness, ( - ) tingling, ( - ) new weaknesses Behavioral/Psych: ( - ) mood change, ( - ) new changes  All other systems were reviewed with the patient and are negative.  PHYSICAL EXAMINATION: ECOG PERFORMANCE STATUS: 1 - Symptomatic but completely ambulatory  Vitals:   03/04/21 1306  BP: 125/79  Pulse: 70  Resp: 18  Temp: 97.8 F (36.6 C)  SpO2: 100%   Filed Weights   03/04/21 1306  Weight: 199 lb 6 oz (90.4 kg)    GENERAL: well appearing African American female in NAD  SKIN: skin color, texture, turgor are normal, no rashes or significant lesions EYES: conjunctiva are pink and non-injected, sclera clear OROPHARYNX: no exudate, no erythema; lips, buccal mucosa, and tongue normal  LUNGS: clear to auscultation and percussion with normal breathing effort HEART: regular rate & rhythm and no murmurs and no lower extremity edema ABDOMEN: soft, non-tender, non-distended, normal bowel sounds Musculoskeletal: no cyanosis of digits and no clubbing  PSYCH: alert & oriented x 3, fluent speech NEURO: no focal motor/sensory deficits  LABORATORY DATA:  I have reviewed the data as listed CBC Latest Ref Rng & Units 03/04/2021 01/01/2021 11/29/2020  WBC 4.0 - 10.5 K/uL 5.5 3.9(L) 4.6  Hemoglobin 12.0 - 15.0 g/dL 06.2 37.6 11.9(L)  Hematocrit 36.0 - 46.0 % 38.6 37.7 36.6  Platelets 150 - 400 K/uL 368 323 343    CMP Latest Ref Rng & Units 01/01/2021 11/29/2020  Glucose 70 - 99 mg/dL 96 85  BUN 6 - 20 mg/dL 7 7  Creatinine 2.83 - 1.00 mg/dL 1.51 7.61  Sodium 607 - 145 mmol/L 143 141  Potassium 3.5 - 5.1 mmol/L 4.0 4.3  Chloride 98 - 111 mmol/L 106 106  CO2 22 - 32 mmol/L 27 25  Calcium 8.9 - 10.3 mg/dL 9.3 9.1  Total Protein 6.5 -  8.1 g/dL 7.5 7.2  Total Bilirubin 0.3 - 1.2 mg/dL 0.3 0.4  Alkaline Phos 38 - 126 U/L 60 68  AST 15 - 41 U/L 16 12(L)  ALT 0 - 44 U/L 14 8    ASSESSMENT & PLAN Tina Spence is a 20 y.o. female returns for a follow up for iron deficiency without anemia.   #Iron deficiency 2/2 menstrual bleeding -Currently on ferrous sulfate 325 mg once daily. Recommend to continue.  -Continue to incorporate iron rich foods into diet.   -Under the care of OB/GYN and started on birth control in June 2022.  -Labs today show no evidence of anemia but persistent iron deficiency with  ferritin level of 8 and iron saturation of 14%.  --Recommend IV feraheme x 2 doses.  --RTC in 8 weeks with repeat labs.   #Microcytosis: --Hgb electrophoresis from 09/18/2020 confirmed sickle cell trait (heterozygous).  Orders Placed This Encounter  Procedures   CBC with Differential (Cancer Center Only)    Standing Status:   Future    Standing Expiration Date:   03/04/2022   Ferritin    Standing Status:   Future    Standing Expiration Date:   03/04/2022   Iron and TIBC    Standing Status:   Future    Standing Expiration Date:   03/04/2022     All questions were answered. The patient knows to call the clinic with any problems, questions or concerns.  I have spent a total of 25 minutes minutes of face-to-face and non-face-to-face time, preparing to see the patient, performing a medically appropriate examination, counseling and educating the patient, ordering medications/tests, documenting clinical information in the electronic health record, and care coordination.    [name], PA-C Department of Hematology/Oncology [facility] at [facility] Phone: [phone]

## 2021-03-04 NOTE — Telephone Encounter (Signed)
Pt is aware and verbalized thanks. All questions answered.

## 2021-03-04 NOTE — Telephone Encounter (Signed)
-----   Message from Briant Cedar, PA-C sent at 03/04/2021  2:41 PM EDT ----- Please call patient and let her know that due to persistent iron deficiency we will proceed with IV iron infusions. Schedulers will be in touch to schedule the infusions.

## 2021-03-26 ENCOUNTER — Encounter: Payer: Self-pay | Admitting: Obstetrics

## 2021-03-26 ENCOUNTER — Ambulatory Visit (INDEPENDENT_AMBULATORY_CARE_PROVIDER_SITE_OTHER): Payer: Medicaid Other | Admitting: Obstetrics

## 2021-03-26 ENCOUNTER — Other Ambulatory Visit: Payer: Self-pay

## 2021-03-26 VITALS — BP 117/81 | HR 74 | Ht 65.0 in | Wt 205.0 lb

## 2021-03-26 DIAGNOSIS — Z3041 Encounter for surveillance of contraceptive pills: Secondary | ICD-10-CM

## 2021-03-26 DIAGNOSIS — N943 Premenstrual tension syndrome: Secondary | ICD-10-CM | POA: Diagnosis not present

## 2021-03-26 MED ORDER — DROSPIRENONE-ETHINYL ESTRADIOL 3-0.02 MG PO TABS: 1.0000 | ORAL_TABLET | Freq: Every day | ORAL | 11 refills | Status: DC

## 2021-03-26 NOTE — Progress Notes (Addendum)
GYN presents for [facility] management, she wants OCP refills and to talk about PMS, harmones.

## 2021-03-26 NOTE — Progress Notes (Signed)
Subjective:    Tina Spence is a 20 y.o. female who presents for contraception counseling. The patient complains of severe mood swings and irritability with her last period, and is concerned that she may be developing PMS.. The patient is sexually active. Pertinent past medical history: none.  The information documented in the HPI was reviewed and verified.  Menstrual History: OB History   No obstetric history on file.      Patient's last menstrual period was 03/12/2021 (exact date).   Patient Active Problem List   Diagnosis Date Noted   Iron deficiency 03/04/2021   IDA (iron deficiency anemia) 11/29/2020   Past Medical History:  Diagnosis Date   Anxiety    Sickle cell trait (HCC)     Past Surgical History:  Procedure Laterality Date   CYST REMOVAL NECK       Current Outpatient Medications:    butalbital-acetaminophen-caffeine (FIORICET) 50-325-40 MG tablet, Take 1 tablet by mouth 2 (two) times daily as needed for headache or migraine., Disp: , Rfl:    drospirenone-ethinyl estradiol (YAZ) 3-0.02 MG tablet, Take 1 tablet by mouth daily., Disp: 28 tablet, Rfl: 11   FEROSUL 325 (65 Fe) MG tablet, Take 325 mg by mouth every other day., Disp: , Rfl:    ibuprofen (ADVIL) 800 MG tablet, Take 1 tablet (800 mg total) by mouth every 8 (eight) hours as needed. (Patient not taking: Reported on 03/04/2021), Disp: 30 tablet, Rfl: 5   Prenat-Fe Poly-Methfol-FA-DHA (VITAFOL ULTRA) 29-0.6-0.4-200 MG CAPS, Take 1 capsule by mouth daily before breakfast., Disp: 90 capsule, Rfl: 4 No Known Allergies  Social History   Tobacco Use   Smoking status: Never   Smokeless tobacco: Never  Substance Use Topics   Alcohol use: Not Currently    Family History  Problem Relation Age of Onset   Anemia Mother    Sickle cell trait Mother    Sickle cell trait Sister    Sickle cell anemia Other        Review of Systems Constitutional: negative for weight loss Genitourinary:negative for abnormal  menstrual periods and vaginal discharge   Objective:   BP 117/81 (BP Location: Left Arm, Cuff Size: Large)   Pulse 74   Ht 5\' 5"  (1.651 m)   Wt 205 lb (93 kg)   LMP 03/12/2021 (Exact Date)   BMI 34.11 kg/m    General:   Alert and no distress  Skin:   no rash or abnormalities  Lungs:   clear to auscultation bilaterally  Heart:   regular rate and rhythm, S1, S2 normal, no murmur, click, rub or gallop  Breasts:   Not examined             The remainder of the physical exam deferred due to the type of encounter.  Lab Review Urine pregnancy test Labs reviewed yes Radiologic studies reviewed no  I have spent a total of 15 minutes of face-to-face time, excluding clinical staff time, reviewing notes and preparing to see patient, ordering tests and/or medications, and counseling the patient.   Assessment:    20 y.o., continuing OCP (estrogen/progesterone), no contraindications.   Plan:   1. Encounter for surveillance of contraceptive pills Rx: - drospirenone-ethinyl estradiol (YAZ) 3-0.02 MG tablet; Take 1 tablet by mouth daily.  Dispense: 28 tablet; Refill: 11  2. PMS (premenstrual syndrome), possible - will continue to follow clinically     All questions answered. Diagnosis explained in detail, including differential. Discussed healthy lifestyle modifications. 06-18-1996 distributed. Follow up  as needed. Meds ordered this encounter  Medications   drospirenone-ethinyl estradiol (YAZ) 3-0.02 MG tablet    Sig: Take 1 tablet by mouth daily.    Dispense:  28 tablet    Refill:  11    Brock Bad, MD 03/26/2021 9:28 AM

## 2021-04-21 ENCOUNTER — Telehealth (INDEPENDENT_AMBULATORY_CARE_PROVIDER_SITE_OTHER): Payer: Medicaid Other | Admitting: Obstetrics

## 2021-04-21 ENCOUNTER — Other Ambulatory Visit: Payer: Self-pay | Admitting: Obstetrics

## 2021-04-21 ENCOUNTER — Encounter: Payer: Self-pay | Admitting: Obstetrics

## 2021-04-21 DIAGNOSIS — N943 Premenstrual tension syndrome: Secondary | ICD-10-CM | POA: Diagnosis not present

## 2021-04-21 MED ORDER — PAROXETINE HCL ER 25 MG PO TB24
25.0000 mg | ORAL_TABLET | Freq: Every day | ORAL | 11 refills | Status: DC
Start: 2021-04-21 — End: 2021-04-28

## 2021-04-21 NOTE — Progress Notes (Signed)
    GYNECOLOGY VIRTUAL VISIT ENCOUNTER NOTE  Provider location: [facility]   Patient location: Home  I connected with Tina Spence on 04/21/21 at  1:10 PM EST by MyChart Video Encounter and verified that I am speaking with the correct person using two identifiers.   I discussed the limitations, risks, security and privacy concerns of performing an evaluation and management service virtually and the availability of in person appointments. I also discussed with the patient that there may be a patient responsible charge related to this service. The patient expressed understanding and agreed to proceed.   History:  Tina Spence is a 20 y.o. No obstetric history on file. female being evaluated today for severe mood swings and irritability during menses. She denies any abnormal vaginal discharge, bleeding, pelvic pain or other concerns.       Past Medical History:  Diagnosis Date   Anxiety    Sickle cell trait (HCC)    Past Surgical History:  Procedure Laterality Date   CYST REMOVAL NECK     The following portions of the patient's history were reviewed and updated as appropriate: allergies, current medications, past family history, past medical history, past social history, past surgical history and problem list.   Health Maintenance:  Normal pap and negative HRHPV on Rx:.    Review of Systems:  Pertinent items noted in HPI and remainder of comprehensive ROS otherwise negative.  Physical Exam:   General:  Alert, oriented and cooperative. Patient appears to be in no acute distress.  Mental Status: Normal mood and affect. Normal behavior. Normal judgment and thought content.   Respiratory: Normal respiratory effort, no problems with respiration noted  Rest of physical exam deferred due to type of encounter  Labs and Imaging No results found for this or any previous visit (from the past 336 hour(s)). No results found.     Assessment and Plan:     1.  PMS (premenstrual syndrome) Rx: - PARoxetine (PAXIL CR) 25 MG 24 hr tablet; Take 1 tablet (25 mg total) by mouth daily.  Dispense: 30 tablet; Refill: 11       I discussed the assessment and treatment plan with the patient. The patient was provided an opportunity to ask questions and all were answered. The patient agreed with the plan and demonstrated an understanding of the instructions.   The patient was advised to call back or seek an in-person evaluation/go to the ED if the symptoms worsen or if the condition fails to improve as anticipated.  I have spent a total of 15 minutes of non-face-to-face time, excluding clinical staff time, reviewing notes and preparing to see patient, ordering tests and/or medications, and counseling the patient.    Coral Ceo, MD [facility], [facility], [facility] 04/21/21

## 2021-04-23 ENCOUNTER — Other Ambulatory Visit: Payer: Self-pay | Admitting: *Deleted

## 2021-04-23 ENCOUNTER — Telehealth: Payer: Self-pay | Admitting: *Deleted

## 2021-04-23 NOTE — Telephone Encounter (Signed)
Received denial PA for Paxil. Message routed to Dr. Clearance Coots for change in RX.

## 2021-04-23 NOTE — Progress Notes (Signed)
Prior authorization for Paroxetine submitted via CoverMyMeds. Office note uploaded.

## 2021-04-28 ENCOUNTER — Other Ambulatory Visit: Payer: Self-pay | Admitting: Obstetrics

## 2021-04-28 DIAGNOSIS — N943 Premenstrual tension syndrome: Secondary | ICD-10-CM

## 2021-04-28 MED ORDER — PAROXETINE HCL 10 MG PO TABS: 10.0000 mg | ORAL_TABLET | Freq: Every day | ORAL | 11 refills | Status: DC

## 2021-05-02 ENCOUNTER — Inpatient Hospital Stay (HOSPITAL_BASED_OUTPATIENT_CLINIC_OR_DEPARTMENT_OTHER): Payer: Medicaid Other | Admitting: Physician Assistant

## 2021-05-02 ENCOUNTER — Inpatient Hospital Stay: Payer: Medicaid Other | Attending: Physician Assistant

## 2021-05-02 ENCOUNTER — Other Ambulatory Visit: Payer: Self-pay

## 2021-05-02 VITALS — BP 135/78 | HR 67 | Temp 97.5°F | Resp 17 | Wt 200.3 lb

## 2021-05-02 DIAGNOSIS — D500 Iron deficiency anemia secondary to blood loss (chronic): Secondary | ICD-10-CM | POA: Insufficient documentation

## 2021-05-02 DIAGNOSIS — D573 Sickle-cell trait: Secondary | ICD-10-CM | POA: Insufficient documentation

## 2021-05-02 DIAGNOSIS — N920 Excessive and frequent menstruation with regular cycle: Secondary | ICD-10-CM | POA: Diagnosis present

## 2021-05-02 DIAGNOSIS — E611 Iron deficiency: Secondary | ICD-10-CM

## 2021-05-02 LAB — IRON AND TIBC
Iron: 33 ug/dL — ABNORMAL LOW (ref 41–142)
Saturation Ratios: 9 % — ABNORMAL LOW (ref 21–57)
TIBC: 355 ug/dL (ref 236–444)
UIBC: 322 ug/dL (ref 120–384)

## 2021-05-02 LAB — CBC WITH DIFFERENTIAL (CANCER CENTER ONLY)
Abs Immature Granulocytes: 0 10*3/uL (ref 0.00–0.07)
Basophils Absolute: 0 10*3/uL (ref 0.0–0.1)
Basophils Relative: 1 %
Eosinophils Absolute: 0.1 10*3/uL (ref 0.0–0.5)
Eosinophils Relative: 2 %
HCT: 35.9 % — ABNORMAL LOW (ref 36.0–46.0)
Hemoglobin: 11.6 g/dL — ABNORMAL LOW (ref 12.0–15.0)
Immature Granulocytes: 0 %
Lymphocytes Relative: 50 %
Lymphs Abs: 2.9 10*3/uL (ref 0.7–4.0)
MCH: 24.3 pg — ABNORMAL LOW (ref 26.0–34.0)
MCHC: 32.3 g/dL (ref 30.0–36.0)
MCV: 75.3 fL — ABNORMAL LOW (ref 80.0–100.0)
Monocytes Absolute: 0.6 10*3/uL (ref 0.1–1.0)
Monocytes Relative: 10 %
Neutro Abs: 2.2 10*3/uL (ref 1.7–7.7)
Neutrophils Relative %: 37 %
Platelet Count: 337 10*3/uL (ref 150–400)
RBC: 4.77 MIL/uL (ref 3.87–5.11)
RDW: 15.6 % — ABNORMAL HIGH (ref 11.5–15.5)
WBC Count: 5.8 10*3/uL (ref 4.0–10.5)
nRBC: 0 % (ref 0.0–0.2)

## 2021-05-02 LAB — FERRITIN: Ferritin: 8 ng/mL — ABNORMAL LOW (ref 11–307)

## 2021-05-02 NOTE — Progress Notes (Signed)
[facility] [phone]   [phone]  PROGRESS NOTE  Patient Care Team: Pcp, No as PCP - General  Hematological/Oncological History 1) Labs from PCP, [name] from [facility] -08/15/2020: WBC 5.8, Hgb 11.6, MCV 73 (L), Plt 404 -09/18/2020: Iron 14 (L), TIBC 408 (H), Ferritin 3.50 (L). Hgb electrophoresis confirmed sickle cell trait (heterozygous).  -11/05/2020: WBC 5.5, Hgb 12.0, MCV 75 (L), Plt 353.  2) 11/29/2020: Establish care with [name] PA-C   CHIEF COMPLAINTS/PURPOSE OF CONSULTATION:  Iron Deficiency  HISTORY OF PRESENTING ILLNESS:  Tina Spence 20 y.o. female returns for a follow up for iron deficiency.  She is unaccompanied for this visit.  She reports persistent fatigue but continues to complete her ADLs on her own.  Her appetite is decreased over the last 2 weeks without any nausea or vomiting.  She reports having diarrhea, 1 episode per day for the last 2 weeks.  She denies any dietary changes, infectious symptoms or new medications.  Patient denies any abdominal pain.  She denies any easy bruising or signs of bleeding except for her menstrual cycle.  She adds that her menstrual bleeding has worsened in the last couple of cycles.  Each cycle lasts 7 days with 4 days of heavy bleeding.  She continues to be on oral contraceptives.   She is compliant with taking ferrous sulfate with good tolerance. Patient denies any fevers, chills, night sweats, shortness of breath, chest pain or cough. Rest of the 10 point ROS is below.   MEDICAL HISTORY:  Past Medical History:  Diagnosis Date   Anxiety    Sickle cell trait (HCC)     SURGICAL HISTORY: Past Surgical History:  Procedure Laterality Date   CYST REMOVAL NECK      SOCIAL HISTORY: Social History   Socioeconomic History   Marital status: Single    Spouse name: Not on file   Number of children: Not on file   Years of education: Not on file   Highest education level:  Not on file  Occupational History   Not on file  Tobacco Use   Smoking status: Never   Smokeless tobacco: Never  Vaping Use   Vaping Use: Never used  Substance and Sexual Activity   Alcohol use: Not Currently   Drug use: Not Currently   Sexual activity: Never    Birth control/protection: Pill  Other Topics Concern   Not on file  Social History Narrative   Not on file   Social Determinants of Health   Financial Resource Strain: Not on file  Food Insecurity: Not on file  Transportation Needs: Not on file  Physical Activity: Not on file  Stress: Not on file  Social Connections: Not on file  Intimate Partner Violence: Not on file    FAMILY HISTORY: Family History  Problem Relation Age of Onset   Anemia Mother    Sickle cell trait Mother    Sickle cell trait Sister    Sickle cell anemia Other     ALLERGIES:  has No Known Allergies.  MEDICATIONS:  Current Outpatient Medications  Medication Sig Dispense Refill   drospirenone-ethinyl estradiol (YAZ) 3-0.02 MG tablet Take 1 tablet by mouth daily. 28 tablet 11   FEROSUL 325 (65 Fe) MG tablet Take 325 mg by mouth every other day.     ibuprofen (ADVIL) 800 MG tablet Take 1 tablet (800 mg total) by mouth every 8 (eight) hours as needed. 30 tablet 5   PARoxetine (PAXIL)  10 MG tablet Take 1 tablet (10 mg total) by mouth daily. 30 tablet 11   Prenat-Fe Poly-Methfol-FA-DHA (VITAFOL ULTRA) 29-0.6-0.4-200 MG CAPS Take 1 capsule by mouth daily before breakfast. 90 capsule 4   butalbital-acetaminophen-caffeine (FIORICET) 50-325-40 MG tablet Take 1 tablet by mouth 2 (two) times daily as needed for headache or migraine. (Patient not taking: Reported on 05/02/2021)     No current facility-administered medications for this visit.    REVIEW OF SYSTEMS:   Constitutional: ( - ) fevers, ( - )  chills , ( - ) night sweats Eyes: ( - ) blurriness of vision, ( - ) double vision, ( - ) watery eyes Ears, nose, mouth, throat, and face: ( - )  mucositis, ( - ) sore throat Respiratory: ( - ) cough, ( - ) dyspnea, ( - ) wheezes Cardiovascular: ( - ) palpitation, ( - ) chest discomfort, ( - ) lower extremity swelling Gastrointestinal:  ( - ) nausea, ( - ) heartburn, ( + ) change in bowel habits Skin: ( - ) abnormal skin rashes Lymphatics: ( - ) new lymphadenopathy, ( - ) easy bruising Neurological: ( - ) numbness, ( - ) tingling, ( - ) new weaknesses Behavioral/Psych: ( - ) mood change, ( - ) new changes  All other systems were reviewed with the patient and are negative.  PHYSICAL EXAMINATION: ECOG PERFORMANCE STATUS: 1 - Symptomatic but completely ambulatory  Vitals:   05/02/21 1439  BP: 135/78  Pulse: 67  Resp: 17  Temp: (!) 97.5 F (36.4 C)  SpO2: 100%   Filed Weights   05/02/21 1439  Weight: 200 lb 5 oz (90.9 kg)    GENERAL: well appearing African American female in NAD  SKIN: skin color, texture, turgor are normal, no rashes or significant lesions EYES: conjunctiva are pink and non-injected, sclera clear OROPHARYNX: no exudate, no erythema; lips, buccal mucosa, and tongue normal  LUNGS: clear to auscultation and percussion with normal breathing effort HEART: regular rate & rhythm and no murmurs and no lower extremity edema ABDOMEN: soft, non-tender, non-distended, normal bowel sounds Musculoskeletal: no cyanosis of digits and no clubbing  PSYCH: alert & oriented x 3, fluent speech NEURO: no focal motor/sensory deficits  LABORATORY DATA:  I have reviewed the data as listed CBC Latest Ref Rng & Units 05/02/2021 03/04/2021 01/01/2021  WBC 4.0 - 10.5 K/uL 5.8 5.5 3.9(L)  Hemoglobin 12.0 - 15.0 g/dL 11.6(L) 12.4 12.2  Hematocrit 36.0 - 46.0 % 35.9(L) 38.6 37.7  Platelets 150 - 400 K/uL 337 368 323    CMP Latest Ref Rng & Units 01/01/2021 11/29/2020  Glucose 70 - 99 mg/dL 96 85  BUN 6 - 20 mg/dL 7 7  Creatinine 0.44 - 1.00 mg/dL 0.74 0.80  Sodium 135 - 145 mmol/L 143 141  Potassium 3.5 - 5.1 mmol/L 4.0 4.3   Chloride 98 - 111 mmol/L 106 106  CO2 22 - 32 mmol/L 27 25  Calcium 8.9 - 10.3 mg/dL 9.3 9.1  Total Protein 6.5 - 8.1 g/dL 7.5 7.2  Total Bilirubin 0.3 - 1.2 mg/dL 0.3 0.4  Alkaline Phos 38 - 126 U/L 60 68  AST 15 - 41 U/L 16 12(L)  ALT 0 - 44 U/L 14 8    ASSESSMENT & PLAN Tina Spence is a 20 y.o. female returns for a follow up for iron deficiency.   #Iron deficiency anemia 2/2 menstrual bleeding --Labs today show evidence of anemia with Hgb 11.6, MCV 75.3, Iron 33, iron saturation  9% and ferritin 8.  --Recommend IV feraheme x 2 doses.  --Currently on ferrous sulfate 325 mg once daily. Recommend to continue.  --Continue to incorporate iron rich foods into diet.   --Under the care of OB/GYN and started on birth control in June 2022.  ---RTC in 8 weeks with repeat labs.   #Sickle Cell Trait: --Hgb electrophoresis from 09/18/2020 confirmed sickle cell trait (heterozygous).  #Appetite loss/diarrhea: --Uncertain etiology. Consider taking OTC imodium to help with diarrhea.  --Advised patient to follow up with PCP and consider referral to GI.   Orders Placed This Encounter  Procedures   CBC with Differential ([place] Only)    Standing Status:   Future    Standing Expiration Date:   05/02/2022   Ferritin    Standing Status:   Future    Standing Expiration Date:   05/02/2022   Iron and TIBC    Standing Status:   Future    Standing Expiration Date:   05/02/2022     All questions were answered. The patient knows to call the clinic with any problems, questions or concerns.  I have spent a total of 25 minutes minutes of face-to-face and non-face-to-face time, preparing to see the patient, performing a medically appropriate examination, counseling and educating the patient, ordering medications/tests, documenting clinical information in the electronic health record, and care coordination.    Dede Query, PA-C Department of Hematology/Oncology [place] at  [facility] Phone: [phone]

## 2021-05-05 ENCOUNTER — Telehealth: Payer: Self-pay | Admitting: Pharmacy Technician

## 2021-05-05 NOTE — Telephone Encounter (Signed)
Fyi note: Auth Submission: no auth needed Payer: [employer] Medication & CPT/J Code(s) submitted: Feraheme (ferumoxytol) [id number] Route of submission (phone, fax, portal): phone [phone] Auth type: Buy/Bill Units/visits requested: 2 Reference number: [id number] Approval from: 05/05/21 to 07/30/21   Patient will be scheduled as soon as possible. Selena Batten

## 2021-05-06 ENCOUNTER — Telehealth: Payer: Self-pay

## 2021-05-06 NOTE — Telephone Encounter (Signed)
Attempted to contact pt for lab results.  LM for her to call the office.

## 2021-05-06 NOTE — Telephone Encounter (Signed)
-----   Message from Briant Cedar, PA-C sent at 05/02/2021  4:22 PM EST ----- Please call patient and confirm with her that iron levels are still low and we recommend IV iron. Schedulers will be in touch to arrange IV iron infusions. We will see her back in approximately 8 weeks

## 2021-05-06 NOTE — Telephone Encounter (Signed)
Pt advised of lab results and the need for IV iron.  She is aware that scheduling will be calling to make the appts.

## 2021-05-08 ENCOUNTER — Other Ambulatory Visit: Payer: Self-pay

## 2021-05-08 ENCOUNTER — Ambulatory Visit (INDEPENDENT_AMBULATORY_CARE_PROVIDER_SITE_OTHER): Payer: Medicaid Other

## 2021-05-08 VITALS — BP 135/83 | HR 68 | Temp 98.4°F | Resp 16 | Ht 65.0 in | Wt 199.4 lb

## 2021-05-08 DIAGNOSIS — D500 Iron deficiency anemia secondary to blood loss (chronic): Secondary | ICD-10-CM

## 2021-05-08 DIAGNOSIS — D509 Iron deficiency anemia, unspecified: Secondary | ICD-10-CM

## 2021-05-08 DIAGNOSIS — E611 Iron deficiency: Secondary | ICD-10-CM

## 2021-05-08 MED ORDER — ALBUTEROL SULFATE HFA 108 (90 BASE) MCG/ACT IN AERS: 2.0000 | INHALATION_SPRAY | Freq: Once | RESPIRATORY_TRACT | Status: DC | PRN

## 2021-05-08 MED ORDER — METHYLPREDNISOLONE SODIUM SUCC 125 MG IJ SOLR: 125.0000 mg | Freq: Once | INTRAMUSCULAR | Status: DC | PRN

## 2021-05-08 MED ORDER — DIPHENHYDRAMINE HCL 50 MG/ML IJ SOLN: 50.0000 mg | Freq: Once | INTRAMUSCULAR | Status: DC | PRN

## 2021-05-08 MED ORDER — SODIUM CHLORIDE 0.9 % IV SOLN: Freq: Once | INTRAVENOUS | Status: DC | PRN

## 2021-05-08 MED ORDER — EPINEPHRINE 0.3 MG/0.3ML IJ SOAJ: 0.3000 mg | Freq: Once | INTRAMUSCULAR | Status: DC | PRN

## 2021-05-08 MED ORDER — FAMOTIDINE IN NACL 20-0.9 MG/50ML-% IV SOLN: 20.0000 mg | Freq: Once | INTRAVENOUS | Status: DC | PRN

## 2021-05-08 MED ORDER — SODIUM CHLORIDE 0.9 % IV SOLN
510.0000 mg | Freq: Once | INTRAVENOUS | Status: AC
  Administered 2021-05-08: 510 mg via INTRAVENOUS
  Filled 2021-05-08: qty 17

## 2021-05-08 NOTE — Progress Notes (Signed)
Diagnosis: Iron Deficiency Anemia  Provider:  Chilton Greathouse, MD  Procedure: Infusion  IV Type: Peripheral, IV Location: R Antecubital  Feraheme (Ferumoxytol), Dose: 510 mg  Infusion Start Time: 0907 05/08/2021  Infusion Stop Time: 09.33 05/08/2021  Post Infusion IV Care: Observation period completed and Peripheral IV Discontinued  Discharge: Condition: Good, Destination: Home . AVS provided to patient.   Performed by:  Garnette Czech, RN

## 2021-05-15 ENCOUNTER — Ambulatory Visit (INDEPENDENT_AMBULATORY_CARE_PROVIDER_SITE_OTHER): Payer: Medicaid Other

## 2021-05-15 ENCOUNTER — Other Ambulatory Visit: Payer: Self-pay

## 2021-05-15 VITALS — BP 130/85 | HR 73 | Temp 98.6°F | Resp 16 | Ht 65.0 in | Wt 198.0 lb

## 2021-05-15 DIAGNOSIS — D500 Iron deficiency anemia secondary to blood loss (chronic): Secondary | ICD-10-CM

## 2021-05-15 DIAGNOSIS — D509 Iron deficiency anemia, unspecified: Secondary | ICD-10-CM | POA: Diagnosis not present

## 2021-05-15 DIAGNOSIS — E611 Iron deficiency: Secondary | ICD-10-CM

## 2021-05-15 MED ORDER — SODIUM CHLORIDE 0.9 % IV SOLN
510.0000 mg | Freq: Once | INTRAVENOUS | Status: AC
  Administered 2021-05-15: 510 mg via INTRAVENOUS
  Filled 2021-05-15: qty 17

## 2021-05-15 MED ORDER — FAMOTIDINE IN NACL 20-0.9 MG/50ML-% IV SOLN: 20.0000 mg | Freq: Once | INTRAVENOUS | Status: DC | PRN

## 2021-05-15 MED ORDER — DIPHENHYDRAMINE HCL 50 MG/ML IJ SOLN: 50.0000 mg | Freq: Once | INTRAMUSCULAR | Status: DC | PRN

## 2021-05-15 MED ORDER — EPINEPHRINE 0.3 MG/0.3ML IJ SOAJ: 0.3000 mg | Freq: Once | INTRAMUSCULAR | Status: DC | PRN

## 2021-05-15 MED ORDER — SODIUM CHLORIDE 0.9 % IV SOLN: Freq: Once | INTRAVENOUS | Status: DC | PRN

## 2021-05-15 MED ORDER — METHYLPREDNISOLONE SODIUM SUCC 125 MG IJ SOLR: 125.0000 mg | Freq: Once | INTRAMUSCULAR | Status: DC | PRN

## 2021-05-15 MED ORDER — ALBUTEROL SULFATE HFA 108 (90 BASE) MCG/ACT IN AERS: 2.0000 | INHALATION_SPRAY | Freq: Once | RESPIRATORY_TRACT | Status: DC | PRN

## 2021-05-15 NOTE — Progress Notes (Signed)
Diagnosis: Iron Deficiency Anemia  Provider:  Chilton Greathouse, MD  Procedure: Infusion  IV Type: Peripheral, IV Location: L Antecubital  Feraheme (Ferumoxytol), Dose: 510 mg  Infusion Start Time: 0946  Infusion Stop Time: 1003  Post Infusion IV Care: Peripheral IV Discontinued  Discharge: Condition: Good, Destination: Home . AVS provided to patient.   Performed by:  Janny Crute, Lyman Speller, LPN

## 2021-05-28 ENCOUNTER — Other Ambulatory Visit: Payer: Self-pay

## 2021-05-28 ENCOUNTER — Ambulatory Visit
Admission: RE | Admit: 2021-05-28 | Discharge: 2021-05-28 | Disposition: A | Discharge status: Home / Self Care | Payer: Medicaid Other | Source: Ambulatory Visit | Attending: Emergency Medicine | Admitting: Emergency Medicine

## 2021-05-28 VITALS — BP 142/89 | HR 75 | Temp 99.1°F | Resp 20

## 2021-05-28 DIAGNOSIS — J029 Acute pharyngitis, unspecified: Secondary | ICD-10-CM

## 2021-05-28 DIAGNOSIS — M542 Cervicalgia: Secondary | ICD-10-CM | POA: Insufficient documentation

## 2021-05-28 LAB — POCT RAPID STREP A (OFFICE): Rapid Strep A Screen: NEGATIVE

## 2021-05-28 MED ORDER — BACLOFEN 10 MG PO TABS
10.0000 mg | ORAL_TABLET | Freq: Every day | ORAL | 0 refills | Status: AC
Start: 2021-05-28 — End: 2021-06-04

## 2021-05-28 MED ORDER — NAPROXEN 500 MG PO TABS: 500.0000 mg | ORAL_TABLET | Freq: Two times a day (BID) | ORAL | 0 refills | Status: DC

## 2021-05-28 NOTE — ED Provider Notes (Signed)
[facility]    CSN: [id number] Arrival date & time: 05/28/21  1341    HISTORY  No chief complaint on file.  HPI Tina Spence is a 20 y.o. female. Patient reports having pain to her neck that has been going on for about 3 days, patient states the pain is "annoying".  Patient has a slightly elevated temperature on arrival, pulse, respirations and oxygen are normal.  She denies history of frequent streptococcal pharyngitis.   Past Medical History:  Diagnosis Date   Anxiety    Sickle cell trait [facility])    Patient Active Problem List   Diagnosis Date Noted   Iron deficiency 03/04/2021   IDA (iron deficiency anemia) 11/29/2020   Past Surgical History:  Procedure Laterality Date   CYST REMOVAL NECK     OB History   No obstetric history on file.    Home Medications    Prior to Admission medications   Medication Sig Start Date End Date Taking? Authorizing Provider  drospirenone-ethinyl estradiol (YAZ) 3-0.02 MG tablet Take 1 tablet by mouth daily. 03/26/21   Shelly Bombard, MD  FEROSUL 325 (65 Fe) MG tablet Take 325 mg by mouth every other day. 10/07/20   [provider]  ibuprofen (ADVIL) 800 MG tablet Take 1 tablet (800 mg total) by mouth every 8 (eight) hours as needed. 12/17/20   Shelly Bombard, MD  PARoxetine (PAXIL) 10 MG tablet Take 1 tablet (10 mg total) by mouth daily. 04/28/21   Shelly Bombard, MD   Family History Family History  Problem Relation Age of Onset   Anemia Mother    Sickle cell trait Mother    Sickle cell trait Sister    Sickle cell anemia Other    Social History Social History   Tobacco Use   Smoking status: Never   Smokeless tobacco: Never  Vaping Use   Vaping Use: Never used  Substance Use Topics   Alcohol use: Not Currently   Drug use: Not Currently   Allergies   Patient has no known allergies.  Review of Systems Review of Systems Pertinent findings noted in history of present illness.   Physical  Exam Triage Vital Signs ED Triage Vitals  Enc Vitals Group     BP 03/28/21 0827 (!) 147/82     Pulse Rate 03/28/21 0827 72     Resp 03/28/21 0827 18     Temp 03/28/21 0827 98.3 F (36.8 C)     Temp Source 03/28/21 0827 Oral     SpO2 03/28/21 0827 98 %     Weight --      Height --      Head Circumference --      Peak Flow --      Pain Score 03/28/21 0826 5     Pain Loc --      Pain Edu? --      Excl. in [place]? --   No data found.  Updated Vital Signs BP (!) 142/89 (BP Location: Right Arm)    Pulse 75    Temp 99.1 F (37.3 C) (Oral)    Resp 20    LMP 05/19/2021    SpO2 98%   Physical Exam Vitals and nursing note reviewed.  Constitutional:      General: She is not in acute distress.    Appearance: Normal appearance. She is not ill-appearing.  HENT:     Head: Normocephalic and atraumatic.     Salivary Glands: Right salivary gland is  not diffusely enlarged or tender. Left salivary gland is not diffusely enlarged or tender.     Right Ear: Tympanic membrane, ear canal and external ear normal. No drainage. No middle ear effusion. There is no impacted cerumen. Tympanic membrane is not erythematous or bulging.     Left Ear: Tympanic membrane, ear canal and external ear normal. No drainage.  No middle ear effusion. There is no impacted cerumen. Tympanic membrane is not erythematous or bulging.     Nose: Nose normal. No nasal deformity, septal deviation, mucosal edema, congestion or rhinorrhea.     Right Turbinates: Not enlarged, swollen or pale.     Left Turbinates: Not enlarged, swollen or pale.     Right Sinus: No maxillary sinus tenderness or frontal sinus tenderness.     Left Sinus: No maxillary sinus tenderness or frontal sinus tenderness.     Mouth/Throat:     Lips: Pink. No lesions.     Mouth: Mucous membranes are moist. No oral lesions.     Pharynx: Oropharynx is clear. Uvula midline. No posterior oropharyngeal erythema or uvula swelling.     Tonsils: No tonsillar exudate. 0 on  the right. 0 on the left.  Eyes:     General: Lids are normal.        Right eye: No discharge.        Left eye: No discharge.     Extraocular Movements: Extraocular movements intact.     Conjunctiva/sclera: Conjunctivae normal.     Right eye: Right conjunctiva is not injected.     Left eye: Left conjunctiva is not injected.  Neck:     Trachea: Trachea and phonation normal. No tracheal tenderness or tracheal deviation.  Cardiovascular:     Rate and Rhythm: Normal rate and regular rhythm.     Pulses: Normal pulses.     Heart sounds: Normal heart sounds. No murmur heard.   No friction rub. No gallop.  Pulmonary:     Effort: Pulmonary effort is normal. No accessory muscle usage, prolonged expiration or respiratory distress.     Breath sounds: Normal breath sounds. No stridor, decreased air movement or transmitted upper airway sounds. No decreased breath sounds, wheezing, rhonchi or rales.  Chest:     Chest wall: No tenderness.  Musculoskeletal:        General: Normal range of motion.     Cervical back: Normal range of motion and neck supple. Normal range of motion.  Lymphadenopathy:     Cervical: Cervical adenopathy present.     Right cervical: Superficial cervical adenopathy, deep cervical adenopathy and posterior cervical adenopathy present.     Left cervical: Superficial cervical adenopathy, deep cervical adenopathy and posterior cervical adenopathy present.  Skin:    General: Skin is warm and dry.     Findings: No erythema or rash.  Neurological:     General: No focal deficit present.     Mental Status: She is alert and oriented to person, place, and time.  Psychiatric:        Mood and Affect: Mood normal.        Behavior: Behavior normal.    Visual Acuity Right Eye Distance:   Left Eye Distance:   Bilateral Distance:    Right Eye Near:   Left Eye Near:    Bilateral Near:     UC Couse / Diagnostics / Procedures:    EKG  Radiology No results  found.  Procedures Procedures (including critical care time)  UC Diagnoses / Final  Clinical Impressions(s)   I have reviewed the triage vital signs and the nursing notes.  Pertinent labs & imaging results that were available during my care of the patient were reviewed by me and considered in my medical decision making (see chart for details).   Final diagnoses:  Acute pharyngitis, unspecified etiology  Neck pain   Physical exam is only remarkable for mild inflammation of cervical lymph nodes, patient provided with prescription for naproxen and baclofen for pain relief.  Rapid strep test was negative.  COVID/flu testing is pending, throat culture is pending.  We will treat patient for streptococcal pharyngitis as needed based on culture result.  Antivirals are no longer indicated due to duration of symptoms.  Patient advised to follow-up with primary care within a week if symptoms have not improved, go to the emergency room if symptoms worsen.  ED Prescriptions     Medication Sig Dispense Auth. Provider   naproxen (NAPROSYN) 500 MG tablet Take 1 tablet (500 mg total) by mouth 2 (two) times daily. 30 tablet Theadora Rama Scales, PA-C   baclofen (LIORESAL) 10 MG tablet Take 1 tablet (10 mg total) by mouth at bedtime for 7 days. 7 tablet Theadora Rama Scales, PA-C      PDMP not reviewed this encounter.  Pending results:  Labs Reviewed  COVID-19, FLU A+B NAA  CULTURE, GROUP A STREP [facility])  POCT RAPID STREP A (OFFICE)    Medications Ordered in UC: Medications - No data to display  Disposition Upon Discharge:  Condition: stable for discharge home Home: take medications as prescribed; routine discharge instructions as discussed; follow up as advised.  Patient presented with an acute illness with associated systemic symptoms and significant discomfort requiring urgent management. In my opinion, this is a condition that a prudent lay person (someone who possesses an average knowledge  of health and medicine) may potentially expect to result in complications if not addressed urgently such as respiratory distress, impairment of bodily function or dysfunction of bodily organs.   Routine symptom specific, illness specific and/or disease specific instructions were discussed with the patient and/or caregiver at length.   As such, the patient has been evaluated and assessed, work-up was performed and treatment was provided in alignment with urgent care protocols and evidence based medicine.  Patient/parent/caregiver has been advised that the patient may require follow up for further testing and treatment if the symptoms continue in spite of treatment, as clinically indicated and appropriate.  If the patient was tested for COVID-19, Influenza and/or RSV, then the patient/parent/guardian was advised to isolate at home pending the results of his/her diagnostic coronavirus test and potentially longer if theyre positive. I have also advised pt that if his/her COVID-19 test returns positive, it's recommended to self-isolate for at least 10 days after symptoms first appeared AND until fever-free for 24 hours without fever reducer AND other symptoms have improved or resolved. Discussed self-isolation recommendations as well as instructions for household member/close contacts as per the [facility] and [place] DHHS, and also gave patient the COVID packet with this information.  Patient/parent/caregiver has been advised to return to the [facility] or PCP in 3-5 days if no better; to PCP or the Emergency Department if new signs and symptoms develop, or if the current signs or symptoms continue to change or worsen for further workup, evaluation and treatment as clinically indicated and appropriate  The patient will follow up with their current PCP if and as advised. If the patient does not currently have a PCP  we will assist them in obtaining one.   The patient may need specialty follow up if the symptoms continue, in  spite of conservative treatment and management, for further workup, evaluation, consultation and treatment as clinically indicated and appropriate.  Patient/parent/caregiver verbalized understanding and agreement of plan as discussed.  All questions were addressed during visit.  Please see discharge instructions below for further details of plan.  Discharge Instructions:   Discharge Instructions      For your "annoying" neck pain, I recommend that you change from ibuprofen to naproxen, provided you with a 500 mg dose that you can take twice daily for pain.  Also provided you with a muscle relaxer that you can take at bedtime.  Between these 2 medications I anticipate that you will have some pain relief.  The rapid strep test was performed the office was negative, throat culture will be performed per our protocol, if the throat culture is positive, you will be provided with that a prescription for antibiotics to treat this.  The throat culture typically takes 3 to 5 days.  You are also tested for COVID and influenza in the office, the results of this test take 2 to 3 days, initially the result is posted to your MyChart and if there is a positive result, you will receive a phone call.  Because you have had symptoms for 3 days, you are no longer eligible for antiviral treatment.  Therefore, conservative care is recommended.  Conservative care includes rest, drinking plenty of clear fluids, pain medication and eating as tolerated.  If you have not had complete relief of your symptoms in the next 7 days or, if you neck pain becomes worse, recommend that you go to the emergency room for more acute evaluation.      This office note has been dictated using Museum/gallery curator.  Unfortunately, and despite my best efforts, this method of dictation can sometimes lead to occasional typographical or grammatical errors.  I apologize in advance if this occurs.    Lynden Oxford Scales,  PA-C 05/28/21 [phone]

## 2021-05-28 NOTE — Discharge Instructions (Signed)
For your "annoying" neck pain, I recommend that you change from ibuprofen to naproxen, provided you with a 500 mg dose that you can take twice daily for pain.  Also provided you with a muscle relaxer that you can take at bedtime.  Between these 2 medications I anticipate that you will have some pain relief.  The rapid strep test was performed the office was negative, throat culture will be performed per our protocol, if the throat culture is positive, you will be provided with that a prescription for antibiotics to treat this.  The throat culture typically takes 3 to 5 days.  You are also tested for COVID and influenza in the office, the results of this test take 2 to 3 days, initially the result is posted to your MyChart and if there is a positive result, you will receive a phone call.  Because you have had symptoms for 3 days, you are no longer eligible for antiviral treatment.  Therefore, conservative care is recommended.  Conservative care includes rest, drinking plenty of clear fluids, pain medication and eating as tolerated.  If you have not had complete relief of your symptoms in the next 7 days or, if you neck pain becomes worse, recommend that you go to the emergency room for more acute evaluation.

## 2021-05-28 NOTE — ED Triage Notes (Signed)
Patient reports having pain to her neck that has been going on for about 3 days.

## 2021-05-29 LAB — COVID-19, FLU A+B NAA
Influenza A, NAA: NOT DETECTED
Influenza B, NAA: NOT DETECTED
SARS-CoV-2, NAA: NOT DETECTED

## 2021-05-31 LAB — CULTURE, GROUP A STREP (THRC)

## 2021-06-04 ENCOUNTER — Ambulatory Visit: Payer: Medicaid Other | Admitting: Obstetrics and Gynecology

## 2021-06-18 ENCOUNTER — Encounter: Payer: Self-pay | Admitting: Obstetrics

## 2021-06-18 ENCOUNTER — Other Ambulatory Visit (HOSPITAL_COMMUNITY)
Admission: RE | Admit: 2021-06-18 | Discharge: 2021-06-18 | Disposition: A | Discharge status: Home / Self Care | Payer: Medicaid Other | Source: Ambulatory Visit | Attending: Obstetrics and Gynecology | Admitting: Obstetrics and Gynecology

## 2021-06-18 ENCOUNTER — Ambulatory Visit (INDEPENDENT_AMBULATORY_CARE_PROVIDER_SITE_OTHER): Payer: Medicaid Other | Admitting: Obstetrics

## 2021-06-18 ENCOUNTER — Other Ambulatory Visit: Payer: Self-pay

## 2021-06-18 VITALS — BP 115/77 | HR 77 | Wt 198.0 lb

## 2021-06-18 DIAGNOSIS — Z3041 Encounter for surveillance of contraceptive pills: Secondary | ICD-10-CM | POA: Diagnosis not present

## 2021-06-18 DIAGNOSIS — N898 Other specified noninflammatory disorders of vagina: Secondary | ICD-10-CM | POA: Diagnosis present

## 2021-06-18 DIAGNOSIS — E669 Obesity, unspecified: Secondary | ICD-10-CM

## 2021-06-18 DIAGNOSIS — N939 Abnormal uterine and vaginal bleeding, unspecified: Secondary | ICD-10-CM

## 2021-06-18 DIAGNOSIS — Z6832 Body mass index (BMI) 32.0-32.9, adult: Secondary | ICD-10-CM

## 2021-06-18 NOTE — Progress Notes (Signed)
Pt states her last cycle was 15 days - light to heavy- no pain/cramping.  Pt is not sexually active. Pt is on BC pills.

## 2021-06-18 NOTE — Progress Notes (Signed)
Patient ID: Tina Spence, female   DOB: [birth year], 21 y.o.   MRN: [id number]  Chief Complaint  Patient presents with   Gynecologic Exam    HPI Tina Spence is a 21 y.o. female.  Complains of 15 da cycle with her last period.  Contraception is the OCP - Yaz, and she has been doing well prior  to last period.  Also c/o vaginal discharge without odor or irritation.   HPI  Past Medical History:  Diagnosis Date   Anxiety    Sickle cell trait (HCC)     Past Surgical History:  Procedure Laterality Date   CYST REMOVAL NECK      Family History  Problem Relation Age of Onset   Anemia Mother    Sickle cell trait Mother    Sickle cell trait Sister    Sickle cell anemia Other     Social History Social History   Tobacco Use   Smoking status: Never   Smokeless tobacco: Never  Vaping Use   Vaping Use: Never used  Substance Use Topics   Alcohol use: Not Currently   Drug use: Not Currently    No Known Allergies  Current Outpatient Medications  Medication Sig Dispense Refill   drospirenone-ethinyl estradiol (YAZ) 3-0.02 MG tablet Take 1 tablet by mouth daily. 28 tablet 11   FEROSUL 325 (65 Fe) MG tablet Take 325 mg by mouth every other day.     PARoxetine (PAXIL) 10 MG tablet Take 1 tablet (10 mg total) by mouth daily. 30 tablet 11   ibuprofen (ADVIL) 800 MG tablet Take 1 tablet (800 mg total) by mouth every 8 (eight) hours as needed. 30 tablet 5   naproxen (NAPROSYN) 500 MG tablet Take 1 tablet (500 mg total) by mouth 2 (two) times daily. 30 tablet 0   No current facility-administered medications for this visit.    Review of Systems Review of Systems Constitutional: negative for fatigue and weight loss Respiratory: negative for cough and wheezing Cardiovascular: negative for chest pain, fatigue and palpitations Gastrointestinal: negative for abdominal pain and change in bowel habits Genitourinary:positive for prolonged bleeding with last period, and vaginal  discharge Integument/breast: negative for nipple discharge Musculoskeletal:negative for myalgias Neurological: negative for gait problems and tremors Behavioral/Psych: negative for abusive relationship, depression Endocrine: negative for temperature intolerance      Blood pressure 115/77, pulse 77, weight 198 lb (89.8 kg), last menstrual period 05/19/2021.  Physical Exam Physical Exam General:   Alert and no distress  Skin:   no rash or abnormalities  Lungs:   clear to auscultation bilaterally  Heart:   regular rate and rhythm, S1, S2 normal, no murmur, click, rub or gallop  Breasts:   Not examined  Abdomen:  normal findings: no organomegaly, soft, non-tender and no hernia  Pelvis:  External genitalia: normal general appearance Urinary system: urethral meatus normal and bladder without fullness, nontender Vaginal: normal without tenderness, induration or masses Cervix: normal appearance Adnexa: normal bimanual exam Uterus: anteverted and non-tender, normal size   I have spent a total of 20 minutes of face-to-face time, excluding clinical staff time, reviewing notes and preparing to see patient, ordering tests and/or medications, and counseling the patient.   Data Reviewed Wet Prep  Assessment     1. Abnormal uterine bleeding (AUB) - hormonal flux  2. Vaginal discharge Rx: - Cervicovaginal ancillary only( [place])  3. Encounter for surveillance of contraceptive pills - pleased with Yaz until last cycle.  Has not missed any pills.  4. Class 1 obesity without serious comorbidity with body mass index (BMI) of 32.0 to 32.9 in adult, unspecified obesity type - `weight reduction with the aid of dietary changes, exercise and behavioral modification recommended    Plan   Follow up in 6 months for pap smear   Brock Bad, MD, FACOG Attending Obstetrician & Gynecologist, [facility] for [facility], [facility], [place] 06/18/2021

## 2021-06-19 ENCOUNTER — Encounter: Payer: Self-pay | Admitting: Obstetrics

## 2021-06-19 LAB — CERVICOVAGINAL ANCILLARY ONLY
Bacterial Vaginitis (gardnerella): POSITIVE — AB
Candida Glabrata: NEGATIVE
Candida Vaginitis: NEGATIVE
Chlamydia: NEGATIVE
Comment: NEGATIVE
Comment: NEGATIVE
Comment: NEGATIVE
Comment: NEGATIVE
Comment: NEGATIVE
Comment: NORMAL
Neisseria Gonorrhea: NEGATIVE
Trichomonas: NEGATIVE

## 2021-06-23 ENCOUNTER — Other Ambulatory Visit: Payer: Self-pay | Admitting: Obstetrics

## 2021-06-23 ENCOUNTER — Encounter: Payer: Self-pay | Admitting: *Deleted

## 2021-06-23 DIAGNOSIS — B9689 Other specified bacterial agents as the cause of diseases classified elsewhere: Secondary | ICD-10-CM

## 2021-06-23 MED ORDER — METRONIDAZOLE 500 MG PO TABS: 500.0000 mg | ORAL_TABLET | Freq: Two times a day (BID) | ORAL | 2 refills | Status: DC

## 2021-06-23 NOTE — Progress Notes (Signed)
Message sent via MyChart. RX sent by Dr. Harper.

## 2021-06-26 ENCOUNTER — Other Ambulatory Visit: Payer: Self-pay | Admitting: Physician Assistant

## 2021-06-26 DIAGNOSIS — D500 Iron deficiency anemia secondary to blood loss (chronic): Secondary | ICD-10-CM

## 2021-06-27 ENCOUNTER — Other Ambulatory Visit: Payer: Self-pay

## 2021-06-27 ENCOUNTER — Inpatient Hospital Stay (HOSPITAL_BASED_OUTPATIENT_CLINIC_OR_DEPARTMENT_OTHER): Payer: Medicaid Other | Admitting: Physician Assistant

## 2021-06-27 ENCOUNTER — Inpatient Hospital Stay: Payer: Medicaid Other | Attending: Physician Assistant

## 2021-06-27 VITALS — BP 124/80 | HR 82 | Temp 97.9°F | Resp 17 | Wt 201.9 lb

## 2021-06-27 DIAGNOSIS — D573 Sickle-cell trait: Secondary | ICD-10-CM | POA: Diagnosis not present

## 2021-06-27 DIAGNOSIS — N920 Excessive and frequent menstruation with regular cycle: Secondary | ICD-10-CM | POA: Insufficient documentation

## 2021-06-27 DIAGNOSIS — E611 Iron deficiency: Secondary | ICD-10-CM

## 2021-06-27 DIAGNOSIS — D500 Iron deficiency anemia secondary to blood loss (chronic): Secondary | ICD-10-CM | POA: Diagnosis present

## 2021-06-27 DIAGNOSIS — Z79899 Other long term (current) drug therapy: Secondary | ICD-10-CM | POA: Diagnosis not present

## 2021-06-27 LAB — CBC WITH DIFFERENTIAL (CANCER CENTER ONLY)
Abs Immature Granulocytes: 0.01 10*3/uL (ref 0.00–0.07)
Basophils Absolute: 0 10*3/uL (ref 0.0–0.1)
Basophils Relative: 0 %
Eosinophils Absolute: 0.1 10*3/uL (ref 0.0–0.5)
Eosinophils Relative: 2 %
HCT: 37.6 % (ref 36.0–46.0)
Hemoglobin: 12.4 g/dL (ref 12.0–15.0)
Immature Granulocytes: 0 %
Lymphocytes Relative: 40 %
Lymphs Abs: 2.2 10*3/uL (ref 0.7–4.0)
MCH: 26.2 pg (ref 26.0–34.0)
MCHC: 33 g/dL (ref 30.0–36.0)
MCV: 79.3 fL — ABNORMAL LOW (ref 80.0–100.0)
Monocytes Absolute: 0.6 10*3/uL (ref 0.1–1.0)
Monocytes Relative: 11 %
Neutro Abs: 2.5 10*3/uL (ref 1.7–7.7)
Neutrophils Relative %: 47 %
Platelet Count: 345 10*3/uL (ref 150–400)
RBC: 4.74 MIL/uL (ref 3.87–5.11)
RDW: 16.8 % — ABNORMAL HIGH (ref 11.5–15.5)
WBC Count: 5.4 10*3/uL (ref 4.0–10.5)
nRBC: 0 % (ref 0.0–0.2)

## 2021-06-27 LAB — IRON AND IRON BINDING CAPACITY (CC-WL,HP ONLY)
Iron: 83 ug/dL (ref 28–170)
Saturation Ratios: 30 % (ref 10.4–31.8)
TIBC: 276 ug/dL (ref 250–450)
UIBC: 193 ug/dL (ref 148–442)

## 2021-06-27 NOTE — Progress Notes (Signed)
[place] Telephone:([phone]) [phone]   Fax:(336) [phone]  PROGRESS NOTE  Patient Care Team: Pcp, No as PCP - General  Hematological/Oncological History 1) Labs from PCP, Vira Browns from [facility] -08/15/2020: WBC 5.8, Hgb 11.6, MCV 73 (L), Plt 404 -09/18/2020: Iron 14 (L), TIBC 408 (H), Ferritin 3.50 (L). Hgb electrophoresis confirmed sickle cell trait (heterozygous).  -11/05/2020: WBC 5.5, Hgb 12.0, MCV 75 (L), Plt 353.  2) 11/29/2020: Establish care with Dede Query PA-C  3) 05/08/2021-05/15/2021: Received IV Ferhaeme 510 mg x 2 doses   CHIEF COMPLAINTS/PURPOSE OF CONSULTATION:  Iron Deficiency  HISTORY OF PRESENTING ILLNESS:  Tina Spence 21 y.o. female returns for a follow up for iron deficiency.  She is unaccompanied for this visit.  She reports that her energy levels did improve after receiving IV iron but it has slowly declined in the last couple of weeks.  Continues to complete all her daily activities on her own.  She has a good appetite without any dietary restrictions.  Patient has any nausea, vomiting or abdominal pain.  Her bowel habits are regular without any diarrhea or constipation.  Patient has easy bruising or signs of active bleeding.  Patient is currently on oral contraceptives.  She reports that her last menstrual cycle lasted 2 weeks with a few days of heavy bleeding.  She is compliant with taking ferrous sulfate with good tolerance.Patient denies any fevers, chills, night sweats, shortness of breath, chest pain or cough. Rest of the 10 point ROS is below.   MEDICAL HISTORY:  Past Medical History:  Diagnosis Date   Anxiety    Sickle cell trait ([place])     SURGICAL HISTORY: Past Surgical History:  Procedure Laterality Date   CYST REMOVAL NECK      SOCIAL HISTORY: Social History   Socioeconomic History   Marital status: Single    Spouse name: Not on file   Number of children: Not on file   Years of education: Not on file    Highest education level: Not on file  Occupational History   Not on file  Tobacco Use   Smoking status: Never   Smokeless tobacco: Never  Vaping Use   Vaping Use: Never used  Substance and Sexual Activity   Alcohol use: Not Currently   Drug use: Not Currently   Sexual activity: Never    Birth control/protection: Pill  Other Topics Concern   Not on file  Social History Narrative   Not on file   Social Determinants of Health   Financial Resource Strain: Not on file  Food Insecurity: Not on file  Transportation Needs: Not on file  Physical Activity: Not on file  Stress: Not on file  Social Connections: Not on file  Intimate Partner Violence: Not on file    FAMILY HISTORY: Family History  Problem Relation Age of Onset   Anemia Mother    Sickle cell trait Mother    Sickle cell trait Sister    Sickle cell anemia Other     ALLERGIES:  has No Known Allergies.  MEDICATIONS:  Current Outpatient Medications  Medication Sig Dispense Refill   drospirenone-ethinyl estradiol (YAZ) 3-0.02 MG tablet Take 1 tablet by mouth daily. 28 tablet 11   FEROSUL 325 (65 Fe) MG tablet Take 325 mg by mouth every other day.     ibuprofen (ADVIL) 800 MG tablet Take 1 tablet (800 mg total) by mouth every 8 (eight) hours as needed. 30 tablet 5   metroNIDAZOLE (FLAGYL) 500  MG tablet Take 1 tablet (500 mg total) by mouth 2 (two) times daily. 14 tablet 2   naproxen (NAPROSYN) 500 MG tablet Take 1 tablet (500 mg total) by mouth 2 (two) times daily. 30 tablet 0   PARoxetine (PAXIL) 10 MG tablet Take 1 tablet (10 mg total) by mouth daily. 30 tablet 11   No current facility-administered medications for this visit.    REVIEW OF SYSTEMS:   Constitutional: ( - ) fevers, ( - )  chills , ( - ) night sweats Eyes: ( - ) blurriness of vision, ( - ) double vision, ( - ) watery eyes Ears, nose, mouth, throat, and face: ( - ) mucositis, ( - ) sore throat Respiratory: ( - ) cough, ( - ) dyspnea, ( - )  wheezes Cardiovascular: ( - ) palpitation, ( - ) chest discomfort, ( - ) lower extremity swelling Gastrointestinal:  ( - ) nausea, ( - ) heartburn, ( - ) change in bowel habits Skin: ( - ) abnormal skin rashes Lymphatics: ( - ) new lymphadenopathy, ( - ) easy bruising Neurological: ( - ) numbness, ( - ) tingling, ( - ) new weaknesses Behavioral/Psych: ( - ) mood change, ( - ) new changes  All other systems were reviewed with the patient and are negative.  PHYSICAL EXAMINATION: ECOG PERFORMANCE STATUS: 1 - Symptomatic but completely ambulatory  Vitals:   06/27/21 1530  BP: 124/80  Pulse: 82  Resp: 17  Temp: 97.9 F (36.6 C)  SpO2: 100%   Filed Weights   06/27/21 1530  Weight: 201 lb 14.4 oz (91.6 kg)    GENERAL: well appearing African American female in NAD  SKIN: skin color, texture, turgor are normal, no rashes or significant lesions EYES: conjunctiva are pink and non-injected, sclera clear LUNGS: clear to auscultation and percussion with normal breathing effort HEART: regular rate & rhythm and no murmurs and no lower extremity edema Musculoskeletal: no cyanosis of digits and no clubbing  PSYCH: alert & oriented x 3, fluent speech NEURO: no focal motor/sensory deficits  LABORATORY DATA:  I have reviewed the data as listed CBC Latest Ref Rng & Units 06/27/2021 05/02/2021 03/04/2021  WBC 4.0 - 10.5 K/uL 5.4 5.8 5.5  Hemoglobin 12.0 - 15.0 g/dL 12.4 11.6(L) 12.4  Hematocrit 36.0 - 46.0 % 37.6 35.9(L) 38.6  Platelets 150 - 400 K/uL 345 337 368    CMP Latest Ref Rng & Units 01/01/2021 11/29/2020  Glucose 70 - 99 mg/dL 96 85  BUN 6 - 20 mg/dL 7 7  Creatinine 0.44 - 1.00 mg/dL 0.74 0.80  Sodium 135 - 145 mmol/L 143 141  Potassium 3.5 - 5.1 mmol/L 4.0 4.3  Chloride 98 - 111 mmol/L 106 106  CO2 22 - 32 mmol/L 27 25  Calcium 8.9 - 10.3 mg/dL 9.3 9.1  Total Protein 6.5 - 8.1 g/dL 7.5 7.2  Total Bilirubin 0.3 - 1.2 mg/dL 0.3 0.4  Alkaline Phos 38 - 126 U/L 60 68  AST 15 - 41 U/L  16 12(L)  ALT 0 - 44 U/L 14 8    ASSESSMENT & PLAN Kilian Bieschke is a 21 y.o. female returns for a follow up for iron deficiency.   #Iron deficiency anemia 2/2 menstrual bleeding -- Received IV feraheme 510 mg x 2 doses on 05/08/2021-05/15/2021 --Labs today show anemia has resolved wit Hgb 12.4. Iron panel shows ferritin 211, serum iron 83, TIBC 276 and saturation 30%.  --No need for additional IV iron.  --Currently  on ferrous sulfate 325 mg once daily. Recommend to continue.  --Continue to incorporate iron rich foods into diet.   --Under the care of OB/GYN and started on birth control in June 2022.  ---RTC in 3 months with repeat labs  #Sickle Cell Trait: --Hgb electrophoresis from 09/18/2020 confirmed sickle cell trait (heterozygous).   No orders of the defined types were placed in this encounter.    All questions were answered. The patient knows to call the clinic with any problems, questions or concerns.  I have spent a total of 25 minutes minutes of face-to-face and non-face-to-face time, preparing to see the patient, performing a medically appropriate examination, counseling and educating the patient, documenting clinical information in the electronic health record, and care coordination.    Dede Query, PA-C Department of Hematology/Oncology [place] at [facility] Phone: [phone]

## 2021-06-30 ENCOUNTER — Telehealth: Payer: Self-pay

## 2021-06-30 ENCOUNTER — Encounter: Payer: Self-pay | Admitting: Physician Assistant

## 2021-06-30 LAB — FERRITIN: Ferritin: 211 ng/mL (ref 11–307)

## 2021-06-30 NOTE — Telephone Encounter (Signed)
Pt advised with VU and appt schedule for April

## 2021-06-30 NOTE — Telephone Encounter (Signed)
-----   Message from Briant Cedar, PA-C sent at 06/30/2021 10:46 AM EST ----- Please notify patient that iron deficiency anemia has resolved. We will see her back in 3 months

## 2021-07-04 ENCOUNTER — Other Ambulatory Visit: Payer: Self-pay | Admitting: Pharmacy Technician

## 2021-09-10 ENCOUNTER — Telehealth: Payer: Self-pay | Admitting: Hematology and Oncology

## 2021-09-10 NOTE — Telephone Encounter (Signed)
Called patient regarding upcoming appointment, patient is notified. °

## 2021-09-25 ENCOUNTER — Other Ambulatory Visit: Payer: Self-pay | Admitting: Physician Assistant

## 2021-09-25 DIAGNOSIS — D500 Iron deficiency anemia secondary to blood loss (chronic): Secondary | ICD-10-CM

## 2021-09-26 ENCOUNTER — Encounter: Payer: Self-pay | Admitting: Hematology and Oncology

## 2021-09-26 ENCOUNTER — Inpatient Hospital Stay (HOSPITAL_BASED_OUTPATIENT_CLINIC_OR_DEPARTMENT_OTHER): Payer: Medicaid Other | Admitting: Hematology and Oncology

## 2021-09-26 ENCOUNTER — Other Ambulatory Visit: Payer: Self-pay

## 2021-09-26 ENCOUNTER — Inpatient Hospital Stay: Payer: Medicaid Other | Attending: Physician Assistant

## 2021-09-26 VITALS — BP 131/81 | HR 71 | Temp 98.0°F | Resp 15 | Wt 210.4 lb

## 2021-09-26 DIAGNOSIS — N920 Excessive and frequent menstruation with regular cycle: Secondary | ICD-10-CM | POA: Diagnosis present

## 2021-09-26 DIAGNOSIS — D500 Iron deficiency anemia secondary to blood loss (chronic): Secondary | ICD-10-CM | POA: Diagnosis not present

## 2021-09-26 LAB — CBC WITH DIFFERENTIAL (CANCER CENTER ONLY)
Abs Immature Granulocytes: 0.01 10*3/uL (ref 0.00–0.07)
Basophils Absolute: 0 10*3/uL (ref 0.0–0.1)
Basophils Relative: 1 %
Eosinophils Absolute: 0.1 10*3/uL (ref 0.0–0.5)
Eosinophils Relative: 2 %
HCT: 40.3 % (ref 36.0–46.0)
Hemoglobin: 13.4 g/dL (ref 12.0–15.0)
Immature Granulocytes: 0 %
Lymphocytes Relative: 41 %
Lymphs Abs: 1.7 10*3/uL (ref 0.7–4.0)
MCH: 27.5 pg (ref 26.0–34.0)
MCHC: 33.3 g/dL (ref 30.0–36.0)
MCV: 82.8 fL (ref 80.0–100.0)
Monocytes Absolute: 0.3 10*3/uL (ref 0.1–1.0)
Monocytes Relative: 8 %
Neutro Abs: 2 10*3/uL (ref 1.7–7.7)
Neutrophils Relative %: 48 %
Platelet Count: 324 10*3/uL (ref 150–400)
RBC: 4.87 MIL/uL (ref 3.87–5.11)
RDW: 13.2 % (ref 11.5–15.5)
WBC Count: 4 10*3/uL (ref 4.0–10.5)
nRBC: 0 % (ref 0.0–0.2)

## 2021-09-26 LAB — IRON AND IRON BINDING CAPACITY (CC-WL,HP ONLY)
Iron: 104 ug/dL (ref 28–170)
Saturation Ratios: 37 % — ABNORMAL HIGH (ref 10.4–31.8)
TIBC: 283 ug/dL (ref 250–450)
UIBC: 179 ug/dL (ref 148–442)

## 2021-09-26 LAB — FERRITIN: Ferritin: 67 ng/mL (ref 11–307)

## 2021-09-26 NOTE — Progress Notes (Signed)
?Tina Spence ?Telephone:(336) [phone]   [phone] ? ?PROGRESS NOTE ? ?Patient Care Team: ?Pcp, No as PCP - General ? ?Hematological/Oncological History ?1) Labs from PCP, Tina Spence from [facility] ?-08/15/2020: WBC 5.8, Hgb 11.6, MCV 73 (L), Plt 404 ?-09/18/2020: Iron 14 (L), TIBC 408 (H), Ferritin 3.50 (L). Hgb electrophoresis confirmed sickle cell trait (heterozygous).  ?-11/05/2020: WBC 5.5, Hgb 12.0, MCV 75 (L), Plt 353. ?2) 11/29/2020: Establish care with Tina Spence ?3) 05/08/2021-05/15/2021: Received IV Ferhaeme 510 mg x 2 doses ? ?HISTORY OF PRESENTING ILLNESS:  ?Tina Spence 21 y.o. female returns for a follow up for iron deficiency.  ? ?She is unaccompanied for this visit.  Today Ms. Tina Spence notes that she has been doing well.  She has been taking her iron pills with orange juice in the morning.  She reports that if she takes it without food she does feel nauseated and therefore tries to take them with food on her stomach.  She reports her energy levels are improved and currently ranks them as a 7 or 8 out of 10.  Menstrual cycles are under better control and considerably lighter than 4.  She last approximately 5 days and days 2 and 3 are heavy.  She is going to about 6-7 pads per day on her heaviest days.  She notes no other overt signs or symptoms of bleeding.  She is also doing her best to incorporate iron rich foods into her diet.  Patient denies any fevers, chills, night sweats, shortness of breath, chest pain or cough. Rest of the 10 point ROS is below.  ? ?MEDICAL HISTORY:  ?Past Medical History:  ?Diagnosis Date  ? Anxiety   ? Sickle cell trait (HCC)   ? ? ?SURGICAL HISTORY: ?Past Surgical History:  ?Procedure Laterality Date  ? CYST REMOVAL NECK    ? ? ?SOCIAL HISTORY: ?Social History  ? ?Socioeconomic History  ? Marital status: Single  ?  Spouse name: Not on file  ? Number of children: Not on file  ? Years of education: Not on file  ? Highest education  level: Not on file  ?Occupational History  ? Not on file  ?Tobacco Use  ? Smoking status: Never  ? Smokeless tobacco: Never  ?Vaping Use  ? Vaping Use: Never used  ?Substance and Sexual Activity  ? Alcohol use: Not Currently  ? Drug use: Not Currently  ? Sexual activity: Never  ?  Birth control/protection: Pill  ?Other Topics Concern  ? Not on file  ?Social History Narrative  ? Not on file  ? ?Social Determinants of Health  ? ?Financial Resource Strain: Not on file  ?Food Insecurity: Not on file  ?Transportation Needs: Not on file  ?Physical Activity: Not on file  ?Stress: Not on file  ?Social Connections: Not on file  ?Intimate Partner Violence: Not on file  ? ? ?FAMILY HISTORY: ?Family History  ?Problem Relation Age of Onset  ? Anemia Mother   ? Sickle cell trait Mother   ? Sickle cell trait Sister   ? Sickle cell anemia Other   ? ? ?ALLERGIES:  has No Known Allergies. ? ?MEDICATIONS:  ?Current Outpatient Medications  ?Medication Sig Dispense Refill  ? FEROSUL 325 (65 Fe) MG tablet Take 325 mg by mouth every other day.    ? metroNIDAZOLE (FLAGYL) 500 MG tablet Take 1 tablet (500 mg total) by mouth 2 (two) times daily. 14 tablet 2  ? PARoxetine (PAXIL) 10 MG tablet Take  1 tablet (10 mg total) by mouth daily. 30 tablet 11  ? ibuprofen (ADVIL) 800 MG tablet Take 1 tablet (800 mg total) by mouth every 8 (eight) hours as needed. (Patient not taking: Reported on 09/26/2021) 30 tablet 5  ? ?No current facility-administered medications for this visit.  ? ? ?REVIEW OF SYSTEMS:   ?Constitutional: ( - ) fevers, ( - )  chills , ( - ) night sweats ?Eyes: ( - ) blurriness of vision, ( - ) double vision, ( - ) watery eyes ?Ears, nose, mouth, throat, and face: ( - ) mucositis, ( - ) sore throat ?Respiratory: ( - ) cough, ( - ) dyspnea, ( - ) wheezes ?Cardiovascular: ( - ) palpitation, ( - ) chest discomfort, ( - ) lower extremity swelling ?Gastrointestinal:  ( - ) nausea, ( - ) heartburn, ( - ) change in bowel habits ?Skin: ( - )  abnormal skin rashes ?Lymphatics: ( - ) new lymphadenopathy, ( - ) easy bruising ?Neurological: ( - ) numbness, ( - ) tingling, ( - ) new weaknesses ?Behavioral/Psych: ( - ) mood change, ( - ) new changes  ?All other systems were reviewed with the patient and are negative. ? ?PHYSICAL EXAMINATION: ?ECOG PERFORMANCE STATUS: 1 - Symptomatic but completely ambulatory ? ?Vitals:  ? 09/26/21 1323  ?BP: 131/81  ?Pulse: 71  ?Resp: 15  ?Temp: 98 ?F (36.7 ?C)  ?SpO2: 100%  ? ?Filed Weights  ? 09/26/21 1323  ?Weight: 210 lb 6.4 oz (95.4 kg)  ? ? ?GENERAL: well appearing African American female in NAD  ?SKIN: skin color, texture, turgor are normal, no rashes or significant lesions ?EYES: conjunctiva are pink and non-injected, sclera clear ?LUNGS: clear to auscultation and percussion with normal breathing effort ?HEART: regular rate & rhythm and no murmurs and no lower extremity edema ?Musculoskeletal: no cyanosis of digits and no clubbing  ?PSYCH: alert & oriented x 3, fluent speech ?NEURO: no focal motor/sensory deficits ? ?LABORATORY DATA:  ?I have reviewed the data as listed ? ?  Latest Ref Rng & Units 09/26/2021  ?  1:06 PM 06/27/2021  ?  3:18 PM 05/02/2021  ?  2:27 PM  ?CBC  ?WBC 4.0 - 10.5 K/uL 4.0   5.4   5.8    ?Hemoglobin 12.0 - 15.0 g/dL 29.513.4   62.112.4   30.811.6    ?Hematocrit 36.0 - 46.0 % 40.3   37.6   35.9    ?Platelets 150 - 400 K/uL 324   345   337    ? ? ? ?  Latest Ref Rng & Units 01/01/2021  ?  9:23 AM 11/29/2020  ? 12:20 PM  ?CMP  ?Glucose 70 - 99 mg/dL 96   85    ?BUN 6 - 20 mg/dL 7   7    ?Creatinine 0.44 - 1.00 mg/dL 6.570.74   8.460.80    ?Sodium 135 - 145 mmol/L 143   141    ?Potassium 3.5 - 5.1 mmol/L 4.0   4.3    ?Chloride 98 - 111 mmol/L 106   106    ?CO2 22 - 32 mmol/L 27   25    ?Calcium 8.9 - 10.3 mg/dL 9.3   9.1    ?Total Protein 6.5 - 8.1 g/dL 7.5   7.2    ?Total Bilirubin 0.3 - 1.2 mg/dL 0.3   0.4    ?Alkaline Phos 38 - 126 U/L 60   68    ?AST 15 - 41  U/L 16   12    ?ALT 0 - 44 U/L 14   8    ? ? ?ASSESSMENT &  PLAN ?Tina Spence is a 21 y.o. female returns for a follow up for iron deficiency.  ? ?#Iron deficiency anemia 2/2 menstrual bleeding ?-- Received IV feraheme 510 mg x 2 doses on 05/08/2021-05/15/2021 ?--Labs today show anemia has resolved wit Hgb 12.4. Iron panel shows ferritin 211, serum iron 83, TIBC 276 and saturation 30%.  ?--No need for additional IV iron.  ?--Currently on ferrous sulfate 325 mg once daily. Recommend to continue.  ?--Continue to incorporate iron rich foods into diet.   ?--Under the care of OB/GYN and started on birth control in June 2022.  ?---RTC in 6 months with repeat labs ? ?#Sickle Cell Trait: ?--Hgb electrophoresis from 09/18/2020 confirmed sickle cell trait (heterozygous). ? ? ?No orders of the defined types were placed in this encounter. ? ? ? ?All questions were answered. The patient knows to call the clinic with any problems, questions or concerns. ? ?I have spent a total of 25 minutes minutes of face-to-face and non-face-to-face time, preparing to see the patient, performing a medically appropriate examination, counseling and educating the patient, documenting clinical information in the electronic health record, and care coordination.  ? ? ?Ulysees Barns, MD ?Department of Hematology/Oncology ?[facility] ?Phone: [phone] ?Pager: [phone] ?Email: [email or web address] ? ? ? ?

## 2021-09-29 IMAGING — CR DG CHEST 2V
2 series · 2 of 2 positions shown · non-contrast
Comparison: None.

CLINICAL DATA: Shortness of breath, cough, congestion

EXAM:
CHEST - 2 VIEW

[w chest pa]
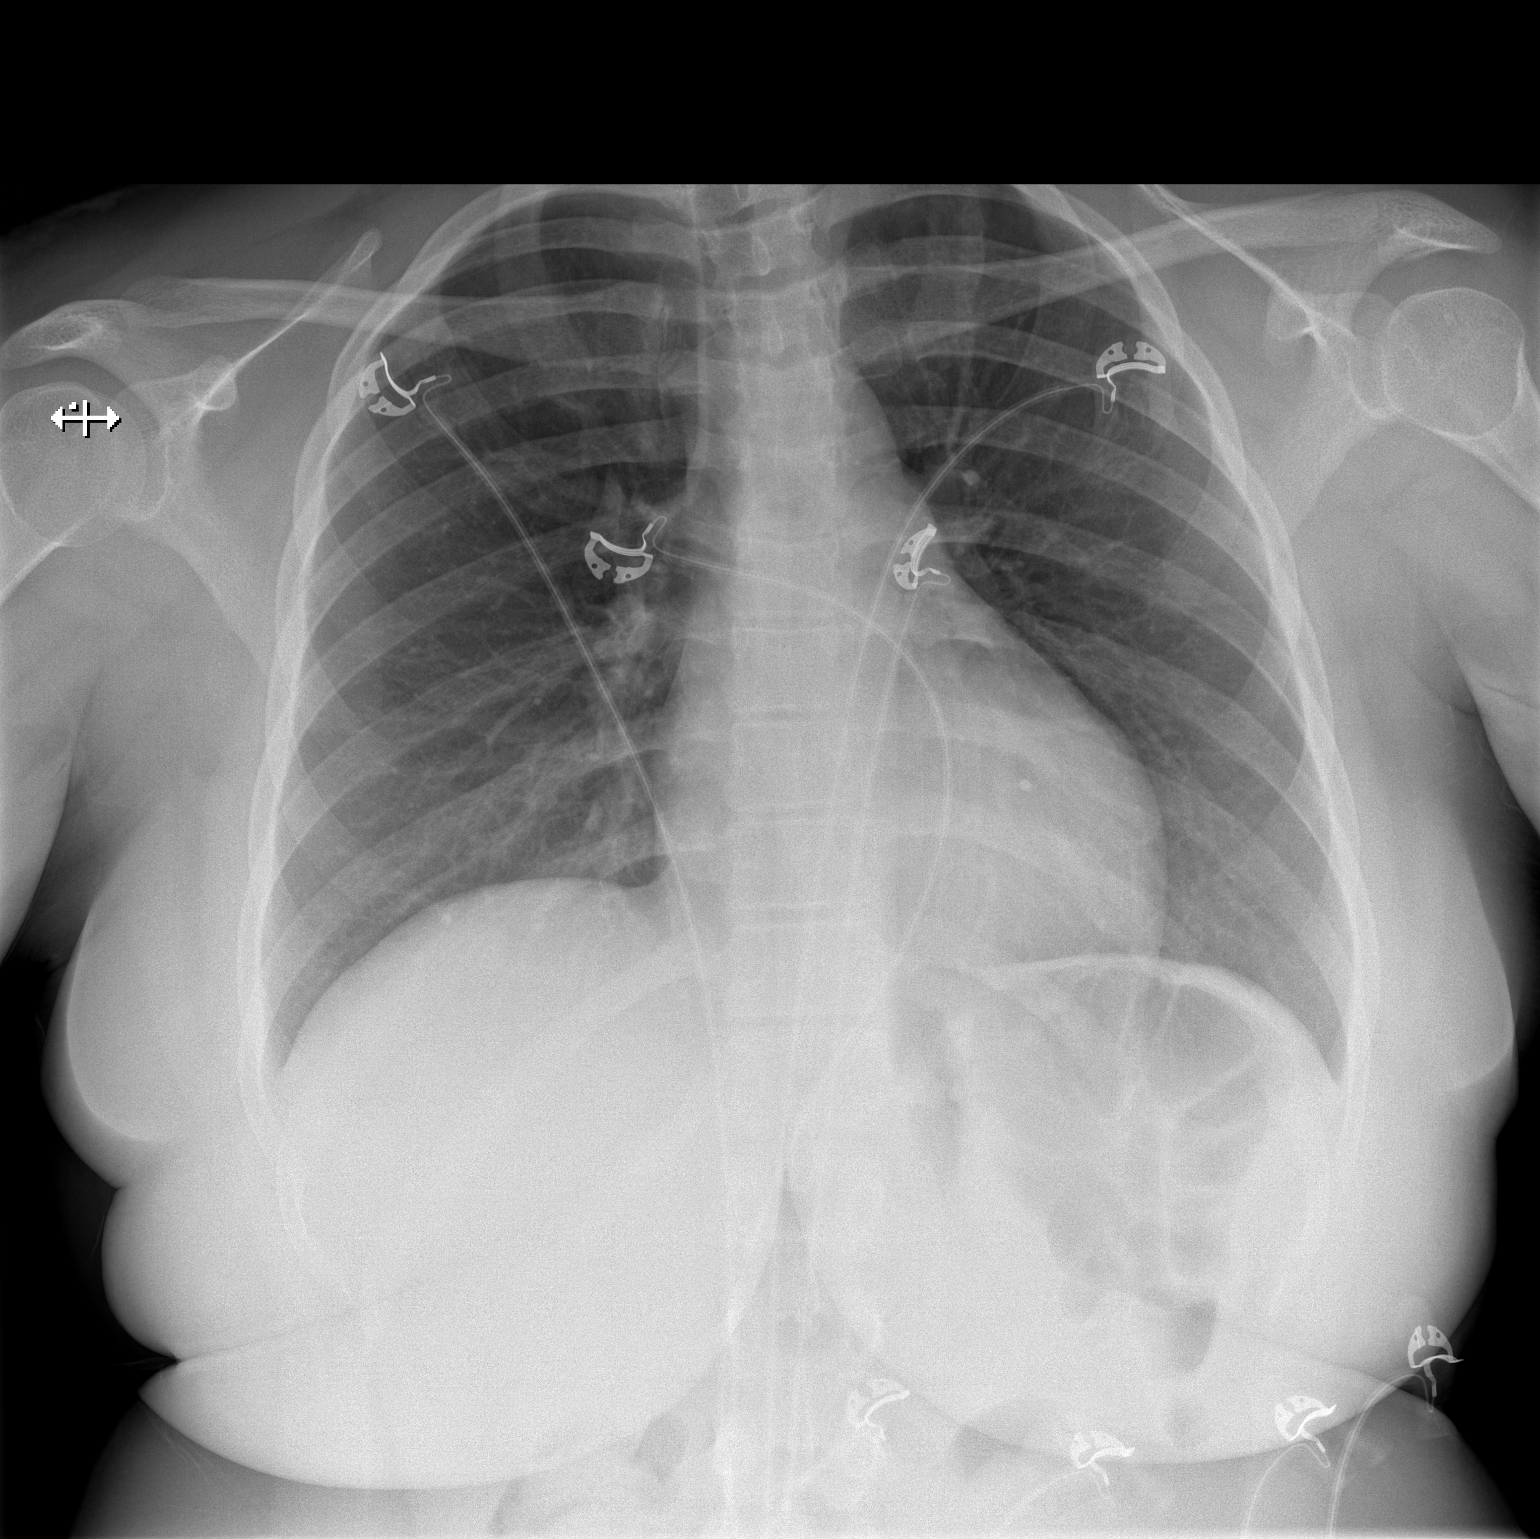

[w chest lat]
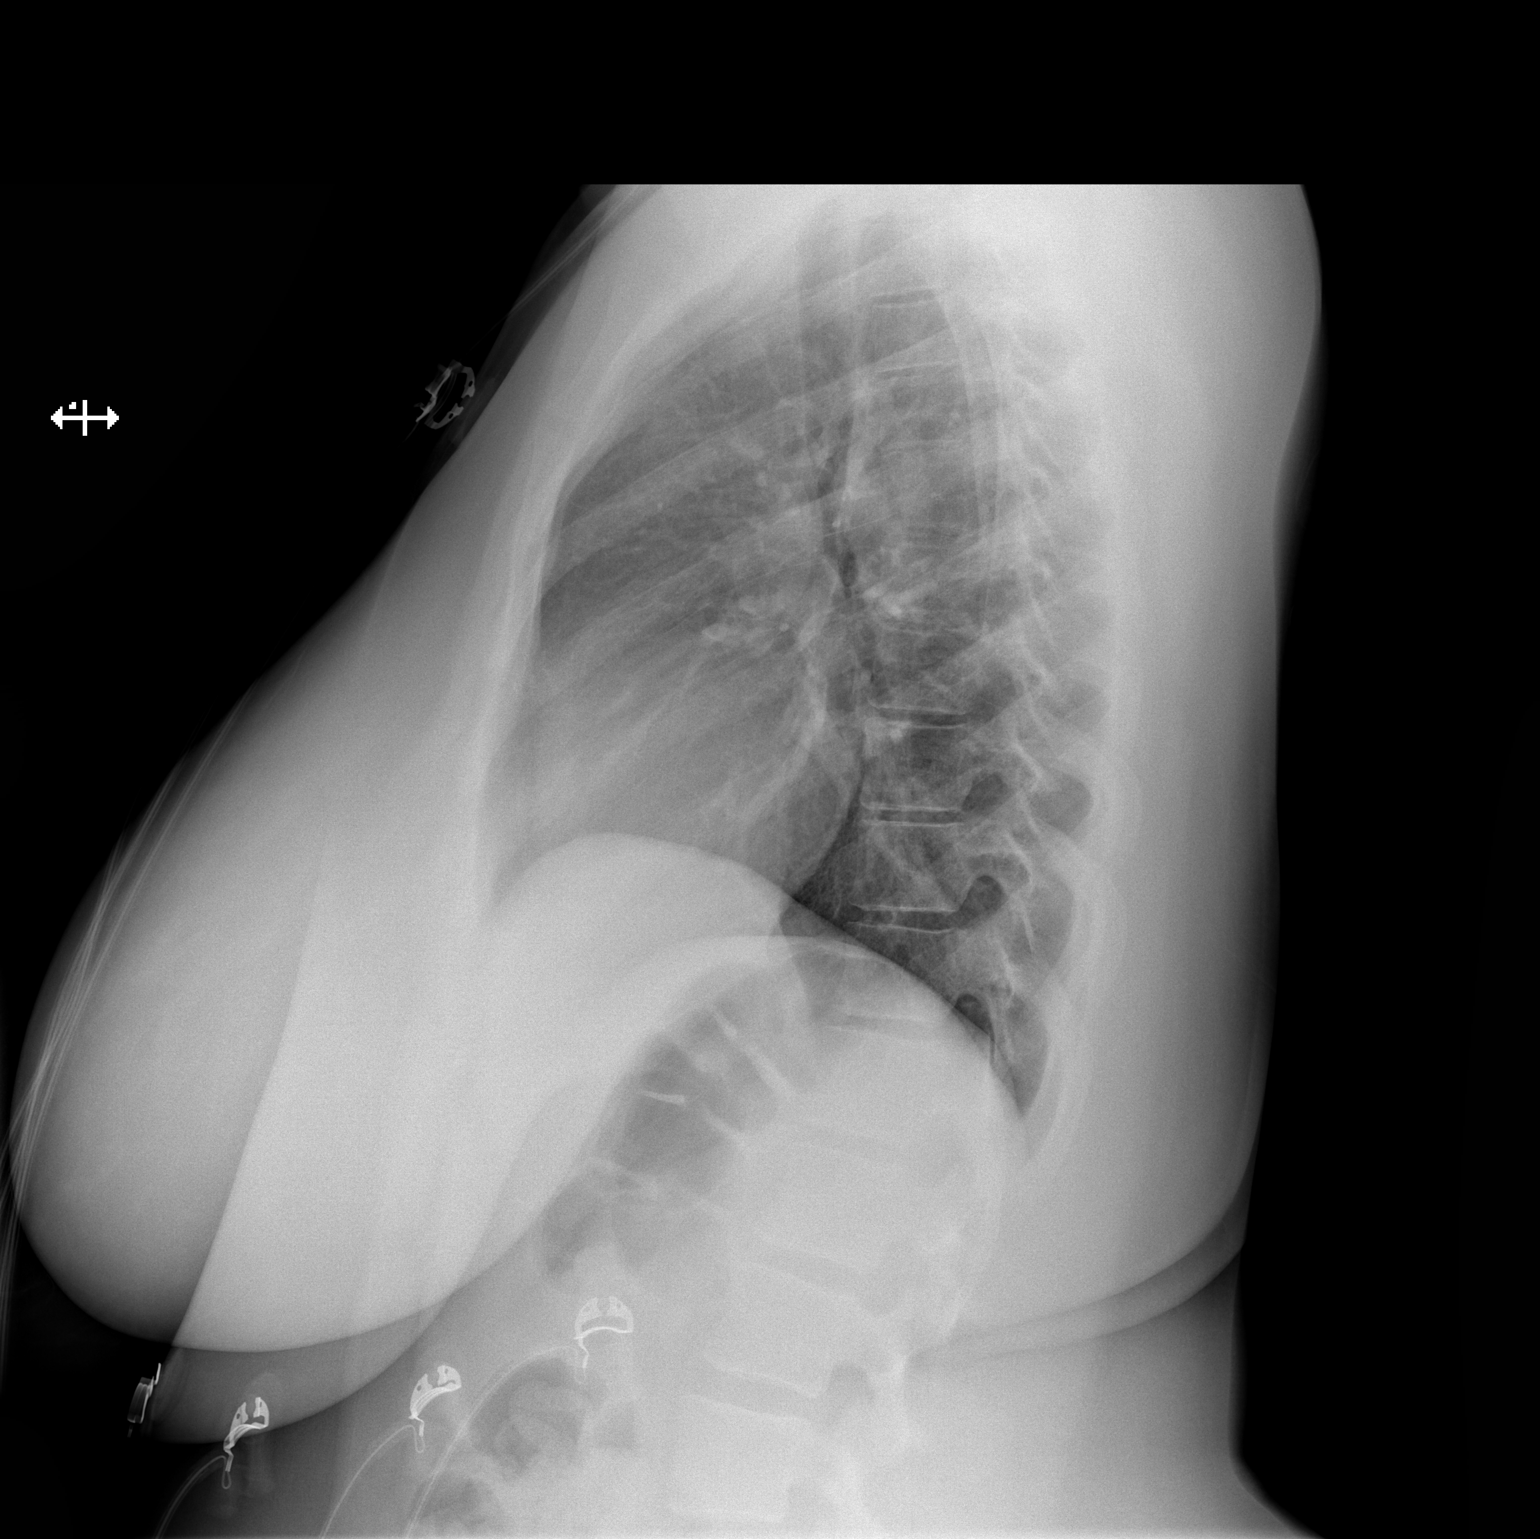

[2 of 2 positions shown; findings below may reference images not displayed]

FINDINGS: The heart size and mediastinal contours are within normal limits.
Both lungs are clear. The visualized skeletal structures are
unremarkable.
IMPRESSION: Normal study.

## 2021-10-22 ENCOUNTER — Ambulatory Visit (INDEPENDENT_AMBULATORY_CARE_PROVIDER_SITE_OTHER): Payer: Medicaid Other | Admitting: Obstetrics

## 2021-10-22 ENCOUNTER — Encounter: Payer: Self-pay | Admitting: Obstetrics

## 2021-10-22 VITALS — BP 121/82 | HR 69 | Ht 65.0 in | Wt 211.0 lb

## 2021-10-22 DIAGNOSIS — Z30011 Encounter for initial prescription of contraceptive pills: Secondary | ICD-10-CM

## 2021-10-22 DIAGNOSIS — N939 Abnormal uterine and vaginal bleeding, unspecified: Secondary | ICD-10-CM | POA: Diagnosis not present

## 2021-10-22 DIAGNOSIS — Z3009 Encounter for other general counseling and advice on contraception: Secondary | ICD-10-CM

## 2021-10-22 DIAGNOSIS — D500 Iron deficiency anemia secondary to blood loss (chronic): Secondary | ICD-10-CM

## 2021-10-22 MED ORDER — FEROSUL 325 (65 FE) MG PO TABS: 325.0000 mg | ORAL_TABLET | ORAL | 6 refills | Status: DC

## 2021-10-22 MED ORDER — LO LOESTRIN FE 1 MG-10 MCG / 10 MCG PO TABS: 1.0000 | ORAL_TABLET | Freq: Every day | ORAL | 4 refills | Status: DC

## 2021-10-22 MED ORDER — VITAFOL ULTRA 29-0.6-0.4-200 MG PO CAPS: 1.0000 | ORAL_CAPSULE | Freq: Every day | ORAL | 4 refills | Status: AC

## 2021-10-22 NOTE — Progress Notes (Signed)
Pt is no longer taking BC pills.   Pt states she recently had sex for the first time and had some bleeding and then started her cycle.

## 2021-10-22 NOTE — Progress Notes (Signed)
Patient ID: Tina Spence, female   DOB: 12-16-2000, 21 y.o.   MRN: [id number]  Chief Complaint  Patient presents with   Gynecologic Exam    HPI Tina Spence is a 21 y.o. female.  Presents for heavy periods with history of iron deficiency anemia, requiring iron transfusion.  She is interested in OCP's for cycle regulation and contraception.  Has been on Yaz in the past with poor cycle regulation. HPI  Past Medical History:  Diagnosis Date   Anxiety    Sickle cell trait (HCC)     Past Surgical History:  Procedure Laterality Date   CYST REMOVAL NECK      Family History  Problem Relation Age of Onset   Anemia Mother    Sickle cell trait Mother    Sickle cell trait Sister    Sickle cell anemia Other     Social History Social History   Tobacco Use   Smoking status: Never   Smokeless tobacco: Never  Vaping Use   Vaping Use: Never used  Substance Use Topics   Alcohol use: Not Currently   Drug use: Not Currently    No Known Allergies  Current Outpatient Medications  Medication Sig Dispense Refill   LO LOESTRIN FE 1 MG-10 MCG / 10 MCG tablet Take 1 tablet by mouth daily. Start taking pills on the first day of period. 28 tablet 4   PARoxetine (PAXIL) 10 MG tablet Take 1 tablet (10 mg total) by mouth daily. 30 tablet 11   Prenat-Fe Poly-Methfol-FA-DHA (VITAFOL ULTRA) 29-0.6-0.4-200 MG CAPS Take 1 capsule by mouth daily before breakfast. 90 capsule 4   FEROSUL 325 (65 Fe) MG tablet Take 1 tablet (325 mg total) by mouth every other day. 30 tablet 6   ibuprofen (ADVIL) 800 MG tablet Take 1 tablet (800 mg total) by mouth every 8 (eight) hours as needed. (Patient not taking: Reported on 09/26/2021) 30 tablet 5   metroNIDAZOLE (FLAGYL) 500 MG tablet Take 1 tablet (500 mg total) by mouth 2 (two) times daily. 14 tablet 2   No current facility-administered medications for this visit.    Review of Systems Review of Systems Constitutional: negative for fatigue and weight  loss Respiratory: negative for cough and wheezing Cardiovascular: negative for chest pain, fatigue and palpitations Gastrointestinal: negative for abdominal pain and change in bowel habits Genitourinary:negative Integument/breast: negative for nipple discharge Musculoskeletal:negative for myalgias Neurological: negative for gait problems and tremors Behavioral/Psych: negative for abusive relationship, depression Endocrine: negative for temperature intolerance      Blood pressure 121/82, pulse 69, height 5\' 5"  (1.651 m), weight 211 lb (95.7 kg), last menstrual period 10/14/2021.  Physical Exam Physical Exam General:   Alert and no distress  Skin:   no rash or abnormalities  Lungs:   clear to auscultation bilaterally  Heart:   regular rate and rhythm, S1, S2 normal, no murmur, click, rub or gallop  The remainder of the physical exam deferred due to type of encounter.   Data Reviewed Labs  Assessment     1. Encounter for other general counseling and advice on contraception  2. Abnormal uterine bleeding (AUB)  3. Iron deficiency anemia due to chronic blood loss Rx: - FEROSUL 325 (65 Fe) MG tablet; Take 1 tablet (325 mg total) by mouth every other day.  Dispense: 30 tablet; Refill: 6 - Prenat-Fe Poly-Methfol-FA-DHA (VITAFOL ULTRA) 29-0.6-0.4-200 MG CAPS; Take 1 capsule by mouth daily before breakfast.  Dispense: 90 capsule; Refill: 4  4. Encounter for initial prescription  of contraceptive pills Rx: - LO LOESTRIN FE 1 MG-10 MCG / 10 MCG tablet; Take 1 tablet by mouth daily. Start taking pills on the first day of period.  Dispense: 28 tablet; Refill: 4    Plan   Follow up in 3 months   Meds ordered this encounter  Medications   LO LOESTRIN FE 1 MG-10 MCG / 10 MCG tablet    Sig: Take 1 tablet by mouth daily. Start taking pills on the first day of period.    Dispense:  28 tablet    Refill:  4    Submit other coverage code 3  BIN:  [id number]  PCN:  [id number]   GRP:  [id number]   ID:   [id number]   FEROSUL 325 (65 Fe) MG tablet    Sig: Take 1 tablet (325 mg total) by mouth every other day.    Dispense:  30 tablet    Refill:  6   Prenat-Fe Poly-Methfol-FA-DHA (VITAFOL ULTRA) 29-0.6-0.4-200 MG CAPS    Sig: Take 1 capsule by mouth daily before breakfast.    Dispense:  90 capsule    Refill:  4      Brock Bad, MD 10/22/2021 3:01 PM

## 2021-10-31 ENCOUNTER — Ambulatory Visit
Admission: EM | Admit: 2021-10-31 | Discharge: 2021-10-31 | Disposition: A | Discharge status: Home / Self Care | Payer: Medicaid Other | Attending: Internal Medicine | Admitting: Internal Medicine

## 2021-10-31 DIAGNOSIS — J039 Acute tonsillitis, unspecified: Secondary | ICD-10-CM | POA: Diagnosis not present

## 2021-10-31 LAB — POCT RAPID STREP A (OFFICE): Rapid Strep A Screen: NEGATIVE

## 2021-10-31 NOTE — ED Triage Notes (Signed)
Pt c/o sore throat, swelling in her tonsils, and seeing white patches.  Started: yesterday

## 2021-10-31 NOTE — ED Provider Notes (Signed)
[facility]    CSN: [id number] Arrival date & time: 10/31/21  1129      History   Chief Complaint Chief Complaint  Patient presents with   Sore Throat    HPI Tina Spence is a 21 y.o. female.   Pt complains of swollen tonsils.  Pt reports her throat began swelling 2 daysa go.  Pt denies fever orr chills   The history is provided by the patient. No language interpreter was used.  Sore Throat This is a new problem. The problem occurs constantly. Nothing aggravates the symptoms. Nothing relieves the symptoms. She has tried nothing for the symptoms. The treatment provided no relief.   Past Medical History:  Diagnosis Date   Anxiety    Sickle cell trait [facility])     Patient Active Problem List   Diagnosis Date Noted   Iron deficiency 03/04/2021   IDA (iron deficiency anemia) 11/29/2020    Past Surgical History:  Procedure Laterality Date   CYST REMOVAL NECK      OB History   No obstetric history on file.      Home Medications    Prior to Admission medications   Medication Sig Start Date End Date Taking? Authorizing Provider  FEROSUL 325 (65 Fe) MG tablet Take 1 tablet (325 mg total) by mouth every other day. 10/22/21   Brock Bad, MD  ibuprofen (ADVIL) 800 MG tablet Take 1 tablet (800 mg total) by mouth every 8 (eight) hours as needed. Patient not taking: Reported on 09/26/2021 12/17/20   Brock Bad, MD  LO LOESTRIN FE 1 MG-10 MCG / 10 MCG tablet Take 1 tablet by mouth daily. Start taking pills on the first day of period. 10/22/21   Brock Bad, MD  metroNIDAZOLE (FLAGYL) 500 MG tablet Take 1 tablet (500 mg total) by mouth 2 (two) times daily. 06/23/21   Brock Bad, MD  PARoxetine (PAXIL) 10 MG tablet Take 1 tablet (10 mg total) by mouth daily. 04/28/21   Brock Bad, MD  Prenat-Fe Poly-Methfol-FA-DHA (VITAFOL ULTRA) 29-0.6-0.4-200 MG CAPS Take 1 capsule by mouth daily before breakfast. 10/22/21   Brock Bad, MD     Family History Family History  Problem Relation Age of Onset   Anemia Mother    Sickle cell trait Mother    Sickle cell trait Sister    Sickle cell anemia Other     Social History Social History   Tobacco Use   Smoking status: Never   Smokeless tobacco: Never  Vaping Use   Vaping Use: Never used  Substance Use Topics   Alcohol use: Not Currently   Drug use: Not Currently     Allergies   Patient has no known allergies.   Review of Systems Review of Systems  HENT:  Positive for sore throat.   All other systems reviewed and are negative.   Physical Exam Triage Vital Signs ED Triage Vitals  Enc Vitals Group     BP 10/31/21 1230 130/84     Pulse Rate 10/31/21 1230 67     Resp 10/31/21 1230 18     Temp 10/31/21 1230 98.3 F (36.8 C)     Temp Source 10/31/21 1230 Oral     SpO2 10/31/21 1230 98 %     Weight --      Height --      Head Circumference --      Peak Flow --      Pain Score 10/31/21  1229 5     Pain Loc --      Pain Edu? --      Excl. in GC? --    No data found.  Updated Vital Signs BP 130/84 (BP Location: Right Arm)   Pulse 67   Temp 98.3 F (36.8 C) (Oral)   Resp 18   LMP 10/14/2021   SpO2 98%   Visual Acuity Right Eye Distance:   Left Eye Distance:   Bilateral Distance:    Right Eye Near:   Left Eye Near:    Bilateral Near:     Physical Exam Vitals reviewed.  Constitutional:      Appearance: She is well-developed.  HENT:     Head: Normocephalic.     Right Ear: Tympanic membrane normal.     Left Ear: Tympanic membrane normal.     Mouth/Throat:     Mouth: Mucous membranes are moist.     Tonsils: 2+ on the right. 2+ on the left.  Cardiovascular:     Rate and Rhythm: Normal rate.  Musculoskeletal:     Cervical back: Normal range of motion.  Skin:    General: Skin is warm.  Neurological:     Mental Status: She is alert.  Psychiatric:        Mood and Affect: Mood normal.     UC Treatments / Results  Labs (all labs  ordered are listed, but only abnormal results are displayed) Labs Reviewed  POCT RAPID STREP A (OFFICE)    EKG   Radiology No results found.  Procedures Procedures (including critical care time)  Medications Ordered in UC Medications - No data to display  Initial Impression / Assessment and Plan / UC Course  I have reviewed the triage vital signs and the nursing notes.  Pertinent labs & imaging results that were available during my care of the patient were reviewed by me and considered in my medical decision making (see chart for details).     Strep is negative   Final Clinical Impressions(s) / UC Diagnoses   Final diagnoses:  None   Discharge Instructions   None    ED Prescriptions   None    PDMP not reviewed this encounter. Pt counseled on symptomatic care  An After Visit Summary was printed and given to the patient.    Elson Areas, [place] 10/31/21 1307

## 2021-10-31 NOTE — Discharge Instructions (Signed)
Tylenol or ibuprofen for discomfort.  Warm salt water gargles 4 times a day. Return if any problems.

## 2021-11-03 LAB — CULTURE, GROUP A STREP (THRC)

## 2021-12-16 ENCOUNTER — Ambulatory Visit
Admission: RE | Admit: 2021-12-16 | Discharge: 2021-12-16 | Disposition: A | Discharge status: Home / Self Care | Payer: Medicaid Other | Source: Ambulatory Visit | Attending: Student | Admitting: Student

## 2021-12-16 VITALS — BP 133/98 | HR 76 | Temp 98.6°F | Resp 20

## 2021-12-16 DIAGNOSIS — J039 Acute tonsillitis, unspecified: Secondary | ICD-10-CM | POA: Diagnosis not present

## 2021-12-16 MED ORDER — AMOXICILLIN-POT CLAVULANATE 875-125 MG PO TABS: 1.0000 | ORAL_TABLET | Freq: Two times a day (BID) | ORAL | 0 refills | Status: DC

## 2021-12-16 MED ORDER — PREDNISONE 20 MG PO TABS: 40.0000 mg | ORAL_TABLET | Freq: Every day | ORAL | 0 refills | Status: AC

## 2021-12-16 NOTE — ED Triage Notes (Signed)
Pt here with swollen tonsils and recurrent tonsil stones x 1 month. Pt has had no fevers or other sxs.

## 2021-12-16 NOTE — Discharge Instructions (Addendum)
-  Start the antibiotic-Augmentin (amoxicillin-clavulanate), 1 pill every 12 hours for 7 days.  You can take this with food like with breakfast and dinner. This will make sure you don't have bacteria growing in your throat.  -Prednisone, 2 pills taken at the same time for 5 days in a row. This will help with the swelling. Try taking this earlier in the day as it can give you energy. Avoid NSAIDs like ibuprofen and alleve while taking this medication as they can increase your risk of stomach upset and even GI bleeding when in combination with a steroid. You can continue tylenol (acetaminophen) up to 1000mg  3x daily. -Follow-up if symptoms worsen - trouble swallowing or talking, new fevers.  -Someone should call you to set up the ENT appointment.

## 2021-12-16 NOTE — ED Provider Notes (Signed)
[facility]    CSN: [id number] Arrival date & time: 12/16/21  0902      History   Chief Complaint Chief Complaint  Patient presents with   Sore Throat    HPI Tina Spence is a 21 y.o. female presenting with swollen tonsils and recurrent tonsil stones for greater than 1 month.  We last saw her 6 weeks ago, at that time she was negative for strep and so was managed with home remedies for relief.  She states that her right tonsil is even larger than the last time, and she has also been dealing with tonsil stones still.  She denies trouble swallowing, voice changes, shortness of breath, sensation of throat closing.  Denies cough, congestion, fevers.  HPI  Past Medical History:  Diagnosis Date   Anxiety    Sickle cell trait [facility])     Patient Active Problem List   Diagnosis Date Noted   Iron deficiency 03/04/2021   IDA (iron deficiency anemia) 11/29/2020    Past Surgical History:  Procedure Laterality Date   CYST REMOVAL NECK      OB History   No obstetric history on file.      Home Medications    Prior to Admission medications   Medication Sig Start Date End Date Taking? Authorizing Provider  amoxicillin-clavulanate (AUGMENTIN) 875-125 MG tablet Take 1 tablet by mouth every 12 (twelve) hours. 12/16/21  Yes Rhys Martini, PA-C  predniSONE (DELTASONE) 20 MG tablet Take 2 tablets (40 mg total) by mouth daily for 5 days. 12/16/21 12/21/21 Yes Rhys Martini, PA-C  FEROSUL 325 (65 Fe) MG tablet Take 1 tablet (325 mg total) by mouth every other day. 10/22/21   Brock Bad, MD  ibuprofen (ADVIL) 800 MG tablet Take 1 tablet (800 mg total) by mouth every 8 (eight) hours as needed. Patient not taking: Reported on 09/26/2021 12/17/20   Brock Bad, MD  LO LOESTRIN FE 1 MG-10 MCG / 10 MCG tablet Take 1 tablet by mouth daily. Start taking pills on the first day of period. 10/22/21   Brock Bad, MD  metroNIDAZOLE (FLAGYL) 500 MG tablet Take 1 tablet (500  mg total) by mouth 2 (two) times daily. 06/23/21   Brock Bad, MD  PARoxetine (PAXIL) 10 MG tablet Take 1 tablet (10 mg total) by mouth daily. 04/28/21   Brock Bad, MD  Prenat-Fe Poly-Methfol-FA-DHA (VITAFOL ULTRA) 29-0.6-0.4-200 MG CAPS Take 1 capsule by mouth daily before breakfast. 10/22/21   Brock Bad, MD    Family History Family History  Problem Relation Age of Onset   Anemia Mother    Sickle cell trait Mother    Sickle cell trait Sister    Sickle cell anemia Other     Social History Social History   Tobacco Use   Smoking status: Never   Smokeless tobacco: Never  Vaping Use   Vaping Use: Never used  Substance Use Topics   Alcohol use: Not Currently   Drug use: Not Currently     Allergies   Patient has no known allergies.   Review of Systems Review of Systems  HENT:  Positive for sore throat.   All other systems reviewed and are negative.    Physical Exam Triage Vital Signs ED Triage Vitals  Enc Vitals Group     BP 12/16/21 0911 (!) 133/98     Pulse Rate 12/16/21 0911 76     Resp 12/16/21 0911 20  Temp 12/16/21 0911 98.6 F (37 C)     Temp src --      SpO2 12/16/21 0911 98 %     Weight --      Height --      Head Circumference --      Peak Flow --      Pain Score 12/16/21 0912 0     Pain Loc --      Pain Edu? --      Excl. in GC? --    No data found.  Updated Vital Signs BP (!) 133/98   Pulse 76   Temp 98.6 F (37 C)   Resp 20   SpO2 98%   Visual Acuity Right Eye Distance:   Left Eye Distance:   Bilateral Distance:    Right Eye Near:   Left Eye Near:    Bilateral Near:     Physical Exam Vitals reviewed.  Constitutional:      General: She is not in acute distress.    Appearance: Normal appearance. She is not ill-appearing or diaphoretic.  HENT:     Head: Normocephalic and atraumatic.     Mouth/Throat:     Pharynx: Uvula midline.     Tonsils: 3+ on the right. 2+ on the left.     Comments: Right tonsil is  3+, left tonsil is 2+.  Tonsilliths throughout.  No exudate. On exam, uvula is midline, she is tolerating her secretions without difficulty, there is no trismus, no drooling, she has normal phonation Cardiovascular:     Rate and Rhythm: Normal rate and regular rhythm.     Heart sounds: Normal heart sounds.  Pulmonary:     Effort: Pulmonary effort is normal.     Breath sounds: Normal breath sounds.  Skin:    General: Skin is warm.  Neurological:     General: No focal deficit present.     Mental Status: She is alert and oriented to person, place, and time.  Psychiatric:        Mood and Affect: Mood normal.        Behavior: Behavior normal.        Thought Content: Thought content normal.        Judgment: Judgment normal.      UC Treatments / Results  Labs (all labs ordered are listed, but only abnormal results are displayed) Labs Reviewed - No data to display  EKG   Radiology No results found.  Procedures Procedures (including critical care time)  Medications Ordered in UC Medications - No data to display  Initial Impression / Assessment and Plan / UC Course  I have reviewed the triage vital signs and the nursing notes.  Pertinent labs & imaging results that were available during my care of the patient were reviewed by me and considered in my medical decision making (see chart for details).     This patient is a very pleasant 21 y.o. year old female presenting with tonsillar hypertrophy. Afebrile, nontachy.  Today on exam uvula is midline, low suspicion for deep space infection.  No evidence of bacteremia, sepsis.  Symptoms for 6 weeks, negative rapid strep at onset of symptoms.  Given right tonsil 3+ and left tonsil 2+, will manage with Augmentin and prednisone today to cover for tonsillar abscess.  I also sent a referral to ENT given persistent symptoms and tonsil stones.   ED return precautions discussed. Patient verbalizes understanding and agreement.     Final  Clinical Impressions(s) /  UC Diagnoses   Final diagnoses:  Acute tonsillitis, unspecified etiology     Discharge Instructions      -Start the antibiotic-Augmentin (amoxicillin-clavulanate), 1 pill every 12 hours for 7 days.  You can take this with food like with breakfast and dinner. This will make sure you don't have bacteria growing in your throat.  -Prednisone, 2 pills taken at the same time for 5 days in a row. This will help with the swelling. Try taking this earlier in the day as it can give you energy. Avoid NSAIDs like ibuprofen and alleve while taking this medication as they can increase your risk of stomach upset and even GI bleeding when in combination with a steroid. You can continue tylenol (acetaminophen) up to 1000mg  3x daily. -Follow-up if symptoms worsen - trouble swallowing or talking, new fevers.  -Someone should call you to set up the ENT appointment.     ED Prescriptions     Medication Sig Dispense Auth. Provider   predniSONE (DELTASONE) 20 MG tablet Take 2 tablets (40 mg total) by mouth daily for 5 days. 10 tablet , PA-C   amoxicillin-clavulanate (AUGMENTIN) 875-125 MG tablet Take 1 tablet by mouth every 12 (twelve) hours. 14 tablet Rhys Martini, PA-C      PDMP not reviewed this encounter.   Rhys Martini, PA-C 12/16/21 [phone]

## 2021-12-24 ENCOUNTER — Telehealth: Payer: Self-pay | Admitting: Hematology and Oncology

## 2021-12-24 NOTE — Telephone Encounter (Signed)
Called patient regarding upcoming November appointments, patient has been called and notified. 

## 2022-01-07 ENCOUNTER — Inpatient Hospital Stay: Payer: Medicaid Other | Admitting: Critical Care Medicine

## 2022-01-15 ENCOUNTER — Encounter: Payer: Self-pay | Admitting: Critical Care Medicine

## 2022-01-15 ENCOUNTER — Ambulatory Visit: Payer: Medicaid Other | Attending: Critical Care Medicine | Admitting: Critical Care Medicine

## 2022-01-15 VITALS — BP 124/89 | HR 58 | Ht 65.0 in | Wt 208.4 lb

## 2022-01-15 DIAGNOSIS — J351 Hypertrophy of tonsils: Secondary | ICD-10-CM | POA: Diagnosis not present

## 2022-01-15 DIAGNOSIS — D500 Iron deficiency anemia secondary to blood loss (chronic): Secondary | ICD-10-CM

## 2022-01-15 DIAGNOSIS — N920 Excessive and frequent menstruation with regular cycle: Secondary | ICD-10-CM | POA: Diagnosis not present

## 2022-01-15 DIAGNOSIS — D573 Sickle-cell trait: Secondary | ICD-10-CM

## 2022-01-15 DIAGNOSIS — Z113 Encounter for screening for infections with a predominantly sexual mode of transmission: Secondary | ICD-10-CM

## 2022-01-15 DIAGNOSIS — Z23 Encounter for immunization: Secondary | ICD-10-CM

## 2022-01-15 DIAGNOSIS — J0391 Acute recurrent tonsillitis, unspecified: Secondary | ICD-10-CM

## 2022-01-15 DIAGNOSIS — Z139 Encounter for screening, unspecified: Secondary | ICD-10-CM

## 2022-01-15 DIAGNOSIS — E611 Iron deficiency: Secondary | ICD-10-CM

## 2022-01-15 DIAGNOSIS — Z6834 Body mass index (BMI) 34.0-34.9, adult: Secondary | ICD-10-CM

## 2022-01-15 DIAGNOSIS — Z3009 Encounter for other general counseling and advice on contraception: Secondary | ICD-10-CM | POA: Insufficient documentation

## 2022-01-15 DIAGNOSIS — Z1159 Encounter for screening for other viral diseases: Secondary | ICD-10-CM

## 2022-01-15 DIAGNOSIS — Z124 Encounter for screening for malignant neoplasm of cervix: Secondary | ICD-10-CM | POA: Insufficient documentation

## 2022-01-15 MED ORDER — CHLORHEXIDINE GLUCONATE 0.12 % MT SOLN: 15.0000 mL | Freq: Two times a day (BID) | OROMUCOSAL | 0 refills | Status: DC

## 2022-01-15 NOTE — Assessment & Plan Note (Signed)
Recurrent tonsillitis

## 2022-01-15 NOTE — Assessment & Plan Note (Signed)
Tonsils are very large almost obstructing the airway will refer to ENT

## 2022-01-15 NOTE — Assessment & Plan Note (Signed)
No signs of symptoms from this

## 2022-01-15 NOTE — Assessment & Plan Note (Signed)
Referral to gynecology

## 2022-01-15 NOTE — Assessment & Plan Note (Signed)
Spent 10 minutes going over contraceptive counseling with the patient she wishes to hold off on medications injections or IUD for now she reviewed all educational materials from [employer] that we had in the office today she will give [place] further thoughts and also discussed with gynecology upon the referral

## 2022-01-15 NOTE — Progress Notes (Signed)
New Patient Office Visit  Subjective    Patient ID: Tina Spence, female    DOB: [birth year]  Age: 21 y.o. 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  She does have a BMI of 34 and half.  But on arrival blood pressure is normal 124/89.  Problem right now she is having tonsillar enlargement with episodes of bleeding from the tonsils if she touches them with her toothbrush.  Also recently had a strep Posta meal 2 nights ago and with this has been having some nausea that was intense initially but now is dissipating.  Another issue is that she has menstrual bleeding that is quite excessive.  She used to be on birth control medications to control this but did not like how the medicine affected her.  She is pondering potential contraception see the [facility] screen below.  She does have a relationship she uses abstinence or female condoms currently.  Patient is due HPV vaccine she agrees to start the series at this visit.  She has been on iron supplements in the past had iron deficiency with menstrual bleeding.  She is due also a Pap smear.  She needs overall health screenings as well.  The patient's not had a primary care provider  Patient does have some anxiety scores high on the GAD-7 but does have a behavioral health counselor at school.  She is not suicidal.  Patient went to urgent care because of throat pain the initial visit they gave no antibiotics a second visit she was given 2 different courses of antibiotics and a course of prednisone she did not like to take antibiotics so she did not take any and took the prednisone alone she states she still has some trouble with some swallowing and occasionally some breathing issues at night when she is sleeping.  She notes no history of sleep apnea  diagnosed. The patient does have a sickle cell trait but has no history of anemia  Her mother's cousin has the full syndrome  Heart disease diabetes runs in the patient's family Outpatient Encounter Medications as of 01/15/2022  Medication Sig   chlorhexidine (PERIDEX) 0.12 % solution Use as directed 15 mLs in the mouth or throat 2 (two) times daily.   FEROSUL 325 (65 Fe) MG tablet Take 1 tablet (325 mg total) by mouth every other day.   Prenat-Fe Poly-Methfol-FA-DHA (VITAFOL ULTRA) 29-0.6-0.4-200 MG CAPS Take 1 capsule by mouth daily before breakfast.   ibuprofen (ADVIL) 800 MG tablet Take 1 tablet (800 mg total) by mouth every 8 (eight) hours as needed. (Patient not taking: Reported on 09/26/2021)   [DISCONTINUED] amoxicillin-clavulanate (AUGMENTIN) 875-125 MG tablet Take 1 tablet by mouth every 12 (twelve) hours.   [DISCONTINUED] LO LOESTRIN FE 1 MG-10 MCG / 10 MCG tablet Take 1 tablet by mouth daily. Start taking pills on the first day of period.   [DISCONTINUED] metroNIDAZOLE (FLAGYL) 500 MG tablet Take 1 tablet (500 mg total) by mouth 2 (two) times daily.   [DISCONTINUED] PARoxetine (PAXIL) 10 MG tablet Take 1 tablet (10 mg total) by mouth daily.   No facility-administered encounter medications on file as of 01/15/2022.    Past Medical History:  Diagnosis Date   Anxiety    Sickle cell trait [facility])     Past Surgical History:  Procedure Laterality Date   CYST REMOVAL NECK      Family History  Problem Relation Age of Onset   Anemia Mother    Sickle cell trait Mother    Stroke Father    Hypertension Father    Heart disease Father    Sickle cell trait Sister    Sickle cell anemia Other     Social History   Socioeconomic History   Marital status: Single    Spouse name: Not on file   Number of children: Not on file   Years of education: Not on file   Highest education level: Not on file  Occupational History   Not on file  Tobacco Use   Smoking status: Never    Smokeless tobacco: Never  Vaping Use   Vaping Use: Never used  Substance and Sexual Activity   Alcohol use: Not Currently   Drug use: Not Currently   Sexual activity: Never    Birth control/protection: Pill  Other Topics Concern   Not on file  Social History Narrative   Not on file   Social Determinants of Health   Financial Resource Strain: Not on file  Food Insecurity: Not on file  Transportation Needs: Not on file  Physical Activity: Not on file  Stress: Not on file  Social Connections: Not on file  Intimate Partner Violence: Not on file    Review of Systems  Constitutional:  Negative for chills, diaphoresis, fever, malaise/fatigue and weight loss.  HENT:  Negative for congestion, ear discharge, ear pain, hearing loss, nosebleeds, sore throat and tinnitus.        Enlarged tonsils  Eyes:  Negative for blurred vision, photophobia and redness.  Respiratory:  Negative for cough, hemoptysis, sputum production, shortness of breath, wheezing and stridor.   Cardiovascular:  Negative for chest pain, palpitations, orthopnea, claudication, leg swelling and PND.  Gastrointestinal:  Negative for abdominal pain, blood in stool, constipation, diarrhea, heartburn, nausea and vomiting.  Genitourinary:  Negative for dysuria, flank pain, frequency, hematuria and urgency.  Musculoskeletal:  Negative for back pain, falls, joint pain, myalgias and neck pain.  Skin:  Negative for itching and rash.  Neurological:  Negative for dizziness, tingling, tremors, sensory change, speech change, focal weakness, seizures, loss of consciousness, weakness and headaches.  Endo/Heme/Allergies: Negative.  Negative for environmental allergies and polydipsia. Does not bruise/bleed easily.       Excess menstrual bleeding  Psychiatric/Behavioral:  Negative for depression, memory loss, substance abuse and suicidal ideas. The patient is not nervous/anxious and does not have insomnia.         Objective    BP  124/89   Pulse (!) 58   Ht 5\' 5"  (1.651 m)   Wt 208 lb 6.4 oz (94.5 kg)   SpO2 98%   BMI 34.68 kg/m   Physical Exam Vitals reviewed.  Constitutional:      Appearance: Normal appearance. She is well-developed. She is obese. She is not diaphoretic.  HENT:     Head: Normocephalic and atraumatic.     Nose: Nose normal. No nasal deformity, septal deviation, mucosal edema or rhinorrhea.     Right Sinus: No maxillary sinus tenderness or frontal sinus tenderness.     Left Sinus: No maxillary sinus tenderness or frontal sinus tenderness.     Mouth/Throat:     Pharynx: No oropharyngeal exudate or posterior oropharyngeal erythema.     Comments: Tonsils are extremely enlarged very small airway no evidence of exudate Eyes:  General: No scleral icterus.    Conjunctiva/sclera: Conjunctivae normal.     Pupils: Pupils are equal, round, and reactive to light.  Neck:     Thyroid: No thyromegaly.     Vascular: No carotid bruit or JVD.     Trachea: Trachea normal. No tracheal tenderness or tracheal deviation.  Cardiovascular:     Rate and Rhythm: Normal rate and regular rhythm.     Chest Wall: PMI is not displaced.     Pulses: Normal pulses. No decreased pulses.     Heart sounds: Normal heart sounds, S1 normal and S2 normal. Heart sounds not distant. No murmur heard.    No systolic murmur is present.     No diastolic murmur is present.     No friction rub. No gallop. No S3 or S4 sounds.  Pulmonary:     Effort: No tachypnea, accessory muscle usage or respiratory distress.     Breath sounds: No stridor. No decreased breath sounds, wheezing, rhonchi or rales.  Chest:     Chest wall: No tenderness.  Abdominal:     General: Bowel sounds are normal. There is no distension.     Palpations: Abdomen is soft. Abdomen is not rigid.     Tenderness: There is no abdominal tenderness. There is no guarding or rebound.  Musculoskeletal:        General: Normal range of motion.     Cervical back: Normal  range of motion and neck supple. No edema, erythema or rigidity. No muscular tenderness. Normal range of motion.  Lymphadenopathy:     Head:     Right side of head: No submental or submandibular adenopathy.     Left side of head: No submental or submandibular adenopathy.     Cervical: No cervical adenopathy.  Skin:    General: Skin is warm and dry.     Coloration: Skin is not pale.     Findings: No rash.     Nails: There is no clubbing.  Neurological:     General: No focal deficit present.     Mental Status: She is alert and oriented to person, place, and time.     Sensory: No sensory deficit.  Psychiatric:        Mood and Affect: Mood normal.        Speech: Speech normal.        Behavior: Behavior normal.        Thought Content: Thought content normal.        Judgment: Judgment normal.         Assessment & Plan:   Problem List Items Addressed This Visit       Respiratory   Recurrent tonsillitis - Primary    Recurrent tonsillitis      Relevant Orders   Ambulatory referral to ENT     Other   Iron deficiency    Need to recheck iron levels and refer to gynecology for menorrhagia and Pap smear      Sickle-cell trait (HCC)    No signs of symptoms from this      Relevant Orders   CBC with Differential/Platelet   Tonsillar enlargement    Tonsils are very large almost obstructing the airway will refer to ENT      Relevant Orders   Ambulatory referral to ENT   Cervical cancer screening    Referral to gynecology      Relevant Orders   Ambulatory referral to Gynecology   Menorrhagia with regular cycle  Referral to gynecology      Relevant Orders   Ambulatory referral to Gynecology   CBC with Differential/Platelet   Iron, TIBC and Ferritin Panel   Encounter for general counseling and advice on contraceptive management    Spent 10 minutes going over contraceptive counseling with the patient she wishes to hold off on medications injections or IUD for now  she reviewed all educational materials from [facility] that we had in the office today she will give [place] further thoughts and also discussed with gynecology upon the referral      RESOLVED: IDA (iron deficiency anemia)   Relevant Orders   Ambulatory referral to Gynecology   CBC with Differential/Platelet   Iron, TIBC and Ferritin Panel   Other Visit Diagnoses     Encounter for health-related screening       Relevant Orders   Lipid panel   Hemoglobin A1c   Thyroid Panel With TSH   Need for hepatitis C screening test       Relevant Orders   HCV Ab w Reflex to Quant PCR   BMI 34.0-34.9,adult       Relevant Orders   Comprehensive metabolic panel   Thyroid Panel With TSH     45 minutes spent complex decision making history and physical contraceptive counseling Patient agreed to receive the first of 2 vaccine series of HPV Return in about 5 months (around 06/17/2022) for obesity, menstrual bleeding.   Shan Levans, MD

## 2022-01-15 NOTE — Assessment & Plan Note (Signed)
Need to recheck iron levels and refer to gynecology for menorrhagia and Pap smear

## 2022-01-15 NOTE — Patient Instructions (Signed)
HPV vaccine was given first in a series of 2 and you will return in 6 to 8 weeks for your second vaccine  Complete health screening labs will be obtained at this visit  Referral to gynecology is made for cervical cancer screening and excess bleeding with periods and iron deficiency  Referral to ENT is made for your tonsillar enlargement you likely need to have a tonsillectomy  Peridex mouthwash swish gargle and spit out twice daily was sent to your pharmacy  Stay on your iron supplement and your prenatal vitamin over-the-counter  Return to see Dr. Delford Field 5 months  Follow lifestyle medicine handout for the multiple suggestions to contain including increasing your exercise by walking 30 minutes 5 times a week and the healthy food choices

## 2022-01-16 ENCOUNTER — Telehealth: Payer: Self-pay

## 2022-01-16 ENCOUNTER — Telehealth: Payer: Self-pay | Admitting: *Deleted

## 2022-01-16 LAB — COMPREHENSIVE METABOLIC PANEL
ALT: 12 IU/L (ref 0–32)
AST: 17 IU/L (ref 0–40)
Albumin/Globulin Ratio: 1.7 (ref 1.2–2.2)
Albumin: 4.5 g/dL (ref 4.0–5.0)
Alkaline Phosphatase: 78 IU/L (ref 44–121)
BUN/Creatinine Ratio: 8 — ABNORMAL LOW (ref 9–23)
BUN: 6 mg/dL (ref 6–20)
Bilirubin Total: 0.3 mg/dL (ref 0.0–1.2)
CO2: 21 mmol/L (ref 20–29)
Calcium: 9.5 mg/dL (ref 8.7–10.2)
Chloride: 104 mmol/L (ref 96–106)
Creatinine, Ser: 0.76 mg/dL (ref 0.57–1.00)
Globulin, Total: 2.7 g/dL (ref 1.5–4.5)
Glucose: 79 mg/dL (ref 70–99)
Potassium: 4.2 mmol/L (ref 3.5–5.2)
Sodium: 142 mmol/L (ref 134–144)
Total Protein: 7.2 g/dL (ref 6.0–8.5)
eGFR: 114 mL/min/{1.73_m2} (ref >59)

## 2022-01-16 LAB — THYROID PANEL WITH TSH
Free Thyroxine Index: 1.8 (ref 1.2–4.9)
T3 Uptake Ratio: 26 % (ref 24–39)
T4, Total: 6.8 ug/dL (ref 4.5–12.0)
TSH: 2.4 u[IU]/mL (ref 0.450–4.500)

## 2022-01-16 LAB — IRON,TIBC AND FERRITIN PANEL
Ferritin: 36 ng/mL (ref 15–150)
Iron Saturation: 17 % (ref 15–55)
Iron: 48 ug/dL (ref 27–159)
Total Iron Binding Capacity: 286 ug/dL (ref 250–450)
UIBC: 238 ug/dL (ref 131–425)

## 2022-01-16 LAB — LIPID PANEL
Chol/HDL Ratio: 2.3 ratio (ref 0.0–4.4)
Cholesterol, Total: 209 mg/dL — ABNORMAL HIGH (ref 100–199)
HDL: 89 mg/dL (ref >39)
LDL Chol Calc (NIH): 112 mg/dL — ABNORMAL HIGH (ref 0–99)
Triglycerides: 42 mg/dL (ref 0–149)
VLDL Cholesterol Cal: 8 mg/dL (ref 5–40)

## 2022-01-16 LAB — CBC WITH DIFFERENTIAL/PLATELET
Basophils Absolute: 0 10*3/uL (ref 0.0–0.2)
Basos: 1 %
EOS (ABSOLUTE): 0.1 10*3/uL (ref 0.0–0.4)
Eos: 3 %
Hematocrit: 37.4 % (ref 34.0–46.6)
Hemoglobin: 12.3 g/dL (ref 11.1–15.9)
Immature Grans (Abs): 0 10*3/uL (ref 0.0–0.1)
Immature Granulocytes: 0 %
Lymphocytes Absolute: 1.5 10*3/uL (ref 0.7–3.1)
Lymphs: 36 %
MCH: 27.3 pg (ref 26.6–33.0)
MCHC: 32.9 g/dL (ref 31.5–35.7)
MCV: 83 fL (ref 79–97)
Monocytes Absolute: 0.3 10*3/uL (ref 0.1–0.9)
Monocytes: 8 %
Neutrophils Absolute: 2.2 10*3/uL (ref 1.4–7.0)
Neutrophils: 52 %
Platelets: 336 10*3/uL (ref 150–450)
RBC: 4.51 x10E6/uL (ref 3.77–5.28)
RDW: 13.8 % (ref 11.7–15.4)
WBC: 4.2 10*3/uL (ref 3.4–10.8)

## 2022-01-16 LAB — HEMOGLOBIN A1C
Est. average glucose Bld gHb Est-mCnc: 108 mg/dL
Hgb A1c MFr Bld: 5.4 % (ref 4.8–5.6)

## 2022-01-16 LAB — HCV INTERPRETATION

## 2022-01-16 LAB — RPR: RPR Ser Ql: NONREACTIVE

## 2022-01-16 LAB — HCV AB W REFLEX TO QUANT PCR: HCV Ab: NONREACTIVE

## 2022-01-16 LAB — HIV ANTIBODY (ROUTINE TESTING W REFLEX): HIV Screen 4th Generation wRfx: NONREACTIVE

## 2022-01-16 NOTE — Telephone Encounter (Signed)
-----   Message from Storm Frisk, MD sent at 01/16/2022  8:17 AM EDT ----- Let pt know kidney liver normal  no diabetes  cholesterol is high follow lifestyle medicine diet and exercise and take one fish oil capsule daily, blood count normal, iron is now normal no need for iron supplement, RPR neg no syphilis , hiv neg, hep c neg, thyroid normal,

## 2022-01-16 NOTE — Telephone Encounter (Signed)
Pt was called and vm was left, Information has been sent to nurse pool.   

## 2022-01-16 NOTE — Progress Notes (Signed)
Let pt know kidney liver normal  no diabetes  cholesterol is high follow lifestyle medicine diet and exercise and take one fish oil capsule daily, blood count normal, iron is now normal no need for iron supplement, RPR neg no syphilis , hiv neg, hep c neg, thyroid normal,

## 2022-01-16 NOTE — Telephone Encounter (Signed)
Pt called back for lab results. Results and provider comments given. Verbalized understanding.   Storm Frisk, MD  01/16/2022  8:17 AM EDT Back to Top    Let pt know kidney liver normal  no diabetes  cholesterol is high follow lifestyle medicine diet and exercise and take one fish oil capsule daily, blood count normal, iron is now normal no need for iron supplement, RPR neg no syphilis , hiv neg, hep c neg, thyroid normal,

## 2022-01-21 ENCOUNTER — Telehealth: Payer: Medicaid Other | Admitting: Student

## 2022-01-21 NOTE — Progress Notes (Signed)
Pt states she stopped Loestrin in July due to prolonged bleeding.   Pt c/o mild pain in the left breast and discharge from nipples.

## 2022-01-26 ENCOUNTER — Encounter: Payer: Self-pay | Admitting: Advanced Practice Midwife

## 2022-01-26 ENCOUNTER — Ambulatory Visit (INDEPENDENT_AMBULATORY_CARE_PROVIDER_SITE_OTHER): Payer: Medicaid Other | Admitting: Advanced Practice Midwife

## 2022-01-26 VITALS — BP 120/76 | HR 72 | Ht 65.0 in | Wt 204.7 lb

## 2022-01-26 DIAGNOSIS — N920 Excessive and frequent menstruation with regular cycle: Secondary | ICD-10-CM | POA: Diagnosis not present

## 2022-01-26 DIAGNOSIS — N643 Galactorrhea not associated with childbirth: Secondary | ICD-10-CM | POA: Diagnosis not present

## 2022-01-26 DIAGNOSIS — N644 Mastodynia: Secondary | ICD-10-CM

## 2022-01-26 NOTE — Progress Notes (Signed)
   GYNECOLOGY PROGRESS NOTE  History:  21 y.o. G0 presents to [facility] office today for problem gyn visit. She reports intermittent sharp pain in her left breast and leaking thin white fluid from the left breast after a shower, and from the right breast with expression.  She has some family hx with maternal great grandmother of breast cancer.  She was on OCPs for heavy menses but symptoms not improved so she stopped OCPs in July.  She denies h/a, dizziness, shortness of breath, n/v, or fever/chills.    The following portions of the patient's history were reviewed and updated as appropriate: allergies, current medications, past family history, past medical history, past social history, past surgical history and problem list.  Health Maintenance Due  Topic Date Due   COVID-19 Vaccine (1) Never done   TETANUS/TDAP  Never done   PAP-Cervical Cytology Screening  Never done   PAP SMEAR-Modifier  Never done   INFLUENZA VACCINE  12/30/2021   HPV VACCINES (2 - 3-dose series) 02/12/2022     Review of Systems:  Pertinent items are noted in HPI.   Objective:  Physical Exam Blood pressure 120/76, pulse 72, height 5\' 5"  (1.651 m), weight 204 lb 11.2 oz (92.9 kg), last menstrual period 01/08/2022. VS reviewed, nursing note reviewed,  Constitutional: well developed, well nourished, no distress HEENT: normocephalic CV: normal rate Pulm/chest wall: normal effort Breast Exam: CBE of left breast without any mass, edema, tenderness, or other abnormalities, no discharge/leaking on exam Abdomen: soft Neuro: alert and oriented x 3 Skin: warm, dry Psych: affect normal Pelvic exam: Deferred  Assessment & Plan:  1. Galactorrhea --No known source, but leaking white, without odor, no itching/burning/redness --Pt recently stopped OCPs so may be medication related --If worsening or does not improve, follow up in office --Will obtain imaging bilaterally to look for abnormalities --lab closing at end of  the day today so will have pt return for lab only visit for prolactin and TSH  - 03/10/2022 BREAST ASPIRATION LEFT; Future - US BREAST ASPIRATION RIGHT; Future - Prolactin - TSH  2. Breast pain, left  - US BREAST ASPIRATION LEFT; Future - US BREAST ASPIRATION RIGHT; Future   3. Menorrhagia with regular cycle --Pt with irregular but not heavy menses like before, does not want to resume OCPs. Using condoms for contraception. --Discussed other methods, questions answered for pt future needs.   Return if symptoms worsen or fail to improve.   [place], CNM 5:25 PM

## 2022-01-26 NOTE — Progress Notes (Signed)
Patient presents today with complaint of white discharge from left and right breast. Started around the beginning of august. Denies feeling any lumps in the breast. Denies redness. Has dull pain in the left breast intermittently. Has a family hx of breast cancer.

## 2022-01-27 ENCOUNTER — Other Ambulatory Visit: Payer: Medicaid Other

## 2022-01-27 DIAGNOSIS — N644 Mastodynia: Secondary | ICD-10-CM

## 2022-01-28 LAB — TSH: TSH: 1.92 u[IU]/mL (ref 0.450–4.500)

## 2022-01-28 LAB — PROLACTIN: Prolactin: 7.9 ng/mL (ref 4.8–23.3)

## 2022-01-28 NOTE — Progress Notes (Signed)
Attempted to connect with Ms. Elicker for MyChart visit. Patient was not available.

## 2022-02-16 ENCOUNTER — Inpatient Hospital Stay: Admission: RE | Admit: 2022-02-16 | Payer: Medicaid Other | Source: Ambulatory Visit

## 2022-02-16 ENCOUNTER — Other Ambulatory Visit: Payer: Medicaid Other

## 2022-03-05 ENCOUNTER — Inpatient Hospital Stay: Admission: RE | Admit: 2022-03-05 | Payer: Medicaid Other | Source: Ambulatory Visit

## 2022-03-05 ENCOUNTER — Other Ambulatory Visit: Payer: Medicaid Other

## 2022-04-28 ENCOUNTER — Inpatient Hospital Stay: Payer: Medicaid Other | Attending: Hematology and Oncology

## 2022-04-28 ENCOUNTER — Other Ambulatory Visit: Payer: Self-pay | Admitting: Hematology and Oncology

## 2022-04-28 ENCOUNTER — Inpatient Hospital Stay: Payer: Medicaid Other | Admitting: Hematology and Oncology

## 2022-04-28 DIAGNOSIS — D500 Iron deficiency anemia secondary to blood loss (chronic): Secondary | ICD-10-CM

## 2022-04-30 ENCOUNTER — Telehealth: Payer: Self-pay | Admitting: Hematology and Oncology

## 2022-04-30 NOTE — Telephone Encounter (Signed)
Called patient to r/s missed appointments. Patient notified of new appointment times 

## 2022-05-01 ENCOUNTER — Other Ambulatory Visit: Payer: Self-pay | Admitting: Hematology and Oncology

## 2022-05-01 ENCOUNTER — Inpatient Hospital Stay (HOSPITAL_BASED_OUTPATIENT_CLINIC_OR_DEPARTMENT_OTHER): Payer: Medicaid Other | Admitting: Hematology and Oncology

## 2022-05-01 ENCOUNTER — Telehealth: Payer: Self-pay | Admitting: Hematology and Oncology

## 2022-05-01 ENCOUNTER — Inpatient Hospital Stay: Payer: Medicaid Other | Attending: Hematology and Oncology

## 2022-05-01 VITALS — BP 135/91 | HR 72 | Temp 98.2°F | Resp 15 | Wt 207.0 lb

## 2022-05-01 DIAGNOSIS — D500 Iron deficiency anemia secondary to blood loss (chronic): Secondary | ICD-10-CM | POA: Insufficient documentation

## 2022-05-01 DIAGNOSIS — D573 Sickle-cell trait: Secondary | ICD-10-CM | POA: Diagnosis not present

## 2022-05-01 DIAGNOSIS — N920 Excessive and frequent menstruation with regular cycle: Secondary | ICD-10-CM | POA: Diagnosis not present

## 2022-05-01 DIAGNOSIS — Z79899 Other long term (current) drug therapy: Secondary | ICD-10-CM | POA: Insufficient documentation

## 2022-05-01 DIAGNOSIS — Z803 Family history of malignant neoplasm of breast: Secondary | ICD-10-CM | POA: Insufficient documentation

## 2022-05-01 LAB — CBC WITH DIFFERENTIAL (CANCER CENTER ONLY)
Abs Immature Granulocytes: 0.01 10*3/uL (ref 0.00–0.07)
Basophils Absolute: 0 10*3/uL (ref 0.0–0.1)
Basophils Relative: 1 %
Eosinophils Absolute: 0.2 10*3/uL (ref 0.0–0.5)
Eosinophils Relative: 3 %
HCT: 36.4 % (ref 36.0–46.0)
Hemoglobin: 12.3 g/dL (ref 12.0–15.0)
Immature Granulocytes: 0 %
Lymphocytes Relative: 47 %
Lymphs Abs: 2.5 10*3/uL (ref 0.7–4.0)
MCH: 26.2 pg (ref 26.0–34.0)
MCHC: 33.8 g/dL (ref 30.0–36.0)
MCV: 77.4 fL — ABNORMAL LOW (ref 80.0–100.0)
Monocytes Absolute: 0.4 10*3/uL (ref 0.1–1.0)
Monocytes Relative: 8 %
Neutro Abs: 2.1 10*3/uL (ref 1.7–7.7)
Neutrophils Relative %: 41 %
Platelet Count: 313 10*3/uL (ref 150–400)
RBC: 4.7 MIL/uL (ref 3.87–5.11)
RDW: 14.2 % (ref 11.5–15.5)
WBC Count: 5.2 10*3/uL (ref 4.0–10.5)
nRBC: 0 % (ref 0.0–0.2)

## 2022-05-01 LAB — CMP (CANCER CENTER ONLY)
ALT: 9 U/L (ref 0–44)
AST: 13 U/L — ABNORMAL LOW (ref 15–41)
Albumin: 4.2 g/dL (ref 3.5–5.0)
Alkaline Phosphatase: 68 U/L (ref 38–126)
Anion gap: 5 (ref 5–15)
BUN: 10 mg/dL (ref 6–20)
CO2: 27 mmol/L (ref 22–32)
Calcium: 9.4 mg/dL (ref 8.9–10.3)
Chloride: 106 mmol/L (ref 98–111)
Creatinine: 0.73 mg/dL (ref 0.44–1.00)
GFR, Estimated: >60 mL/min (ref >60)
Glucose, Bld: 100 mg/dL — ABNORMAL HIGH (ref 70–99)
Potassium: 3.9 mmol/L (ref 3.5–5.1)
Sodium: 138 mmol/L (ref 135–145)
Total Bilirubin: 0.3 mg/dL (ref 0.3–1.2)
Total Protein: 7.1 g/dL (ref 6.5–8.1)

## 2022-05-01 LAB — RETIC PANEL
Immature Retic Fract: 10.3 % (ref 2.3–15.9)
RBC.: 4.71 MIL/uL (ref 3.87–5.11)
Retic Count, Absolute: 46.2 10*3/uL (ref 19.0–186.0)
Retic Ct Pct: 1 % (ref 0.4–3.1)
Reticulocyte Hemoglobin: 29.3 pg (ref >27.9)

## 2022-05-01 LAB — IRON AND IRON BINDING CAPACITY (CC-WL,HP ONLY)
Iron: 41 ug/dL (ref 28–170)
Saturation Ratios: 12 % (ref 10.4–31.8)
TIBC: 339 ug/dL (ref 250–450)
UIBC: 298 ug/dL (ref 148–442)

## 2022-05-01 LAB — FERRITIN: Ferritin: 8 ng/mL — ABNORMAL LOW (ref 11–307)

## 2022-05-01 NOTE — Telephone Encounter (Signed)
Called patient to schedule f/u. Patient scheduled and notified.  

## 2022-05-01 NOTE — Progress Notes (Signed)
[facility] [phone]   [phone]  PROGRESS NOTE  Patient Care Team: Storm Frisk, MD as PCP - General (Pulmonary Disease)  Hematological/Oncological History # Iron Deficiency Anemia 2/2 to GYN Bleeding. 1) Labs from PCP, Tina Spence from [facility] -08/15/2020: WBC 5.8, Hgb 11.6, MCV 73 (L), Plt 404 -09/18/2020: Iron 14 (L), TIBC 408 (H), Ferritin 3.50 (L). Hgb electrophoresis confirmed sickle cell trait (heterozygous).  -11/05/2020: WBC 5.5, Hgb 12.0, MCV 75 (L), Plt 353. 2) 11/29/2020: Establish care with [name] PA-C 3) 05/08/2021-05/15/2021: Received IV Ferhaeme 510 mg x 2 doses 4)  05/01/2022: Hgb 12.3, white blood cell count 5.2, MCV 77.4, and platelets of 313.  Interval History:  Tina Spence 21 y.o. female with medical history significant for iron deficiency anemia secondary to GYN bleeding who presents for a follow up visit. The patient's last visit was on 09/26/2021. In the interim since the last visit she has discontinued her iron pills.  On exam today Tina Spence reports she has been well overall interim since her last visit.  Her energy has been quite good and currently ranks it as a 7 out of 10.  She notes her menstrual cycles are little better than before but she is not currently taking any birth control or oral contraceptive pills.  She reports her cycles occur approximately for 25 days and last for 6 days.  The heaviest days of the first 2 and she goes to about 3 pads per day which are completely soaked.  She notes that she was told to stop her iron pills in August 2023.  The iron pills did not cause any stomach upset, nausea, or constipation.  She reports that she is not having any other overt signs of bleeding such as nosebleeds, gum bleeding, or dark stools.  She reports she is not currently eating red meat because she is concerned about her cholesterol.  She is doing her best to eat "spinach and eggs".  She denies any  fevers, chills, sweats, nausea, vomiting or diarrhea, but does endorse having feelings of dizziness.  A 10 point ROS was otherwise negative.  MEDICAL HISTORY:  Past Medical History:  Diagnosis Date   Anxiety    Sickle cell trait (HCC)     SURGICAL HISTORY: Past Surgical History:  Procedure Laterality Date   CYST REMOVAL NECK      SOCIAL HISTORY: Social History   Socioeconomic History   Marital status: Single    Spouse name: Not on file   Number of children: Not on file   Years of education: Not on file   Highest education level: Not on file  Occupational History   Not on file  Tobacco Use   Smoking status: Never   Smokeless tobacco: Never  Vaping Use   Vaping Use: Never used  Substance and Sexual Activity   Alcohol use: Not Currently   Drug use: Not Currently   Sexual activity: Yes    Birth control/protection: Pill, Condom  Other Topics Concern   Not on file  Social History Narrative   Not on file   Social Determinants of Health   Financial Resource Strain: Not on file  Food Insecurity: Not on file  Transportation Needs: Not on file  Physical Activity: Not on file  Stress: Not on file  Social Connections: Not on file  Intimate Partner Violence: Not on file    FAMILY HISTORY: Family History  Problem Relation Age of Onset   Anemia Mother  Sickle cell trait Mother    Stroke Father    Hypertension Father    Heart disease Father    Sickle cell trait Sister    Breast cancer Maternal Great-grandmother    Sickle cell anemia Other     ALLERGIES:  has No Known Allergies.  MEDICATIONS:  Current Outpatient Medications  Medication Sig Dispense Refill   escitalopram (LEXAPRO) 10 MG tablet Take 10 mg by mouth daily.     FEROSUL 325 (65 Fe) MG tablet Take 1 tablet (325 mg total) by mouth every other day. (Patient not taking: Reported on 05/01/2022) 30 tablet 6   Prenat-Fe Poly-Methfol-FA-DHA (VITAFOL ULTRA) 29-0.6-0.4-200 MG CAPS Take 1 capsule by mouth daily  before breakfast. 90 capsule 4   No current facility-administered medications for this visit.    REVIEW OF SYSTEMS:   Constitutional: ( - ) fevers, ( - )  chills , ( - ) night sweats Eyes: ( - ) blurriness of vision, ( - ) double vision, ( - ) watery eyes Ears, nose, mouth, throat, and face: ( - ) mucositis, ( - ) sore throat Respiratory: ( - ) cough, ( - ) dyspnea, ( - ) wheezes Cardiovascular: ( - ) palpitation, ( - ) chest discomfort, ( - ) lower extremity swelling Gastrointestinal:  ( - ) nausea, ( - ) heartburn, ( - ) change in bowel habits Skin: ( - ) abnormal skin rashes Lymphatics: ( - ) new lymphadenopathy, ( - ) easy bruising Neurological: ( - ) numbness, ( - ) tingling, ( - ) new weaknesses Behavioral/Psych: ( - ) mood change, ( - ) new changes  All other systems were reviewed with the patient and are negative.  PHYSICAL EXAMINATION:  Vitals:   05/01/22 0905  BP: (!) 135/91  Pulse: 72  Resp: 15  Temp: 98.2 F (36.8 C)  SpO2: 97%   Filed Weights   05/01/22 0905  Weight: 207 lb (93.9 kg)    GENERAL: Well-appearing young African-American female, alert, no distress and comfortable SKIN: skin color, texture, turgor are normal, no rashes or significant lesions EYES: conjunctiva are pink and non-injected, sclera clear LUNGS: clear to auscultation and percussion with normal breathing effort HEART: regular rate & rhythm and no murmurs and no lower extremity edema Musculoskeletal: no cyanosis of digits and no clubbing  PSYCH: alert & oriented x 3, fluent speech NEURO: no focal motor/sensory deficits  LABORATORY DATA:  I have reviewed the data as listed    Latest Ref Rng & Units 05/01/2022    8:30 AM 01/15/2022   11:06 AM 09/26/2021    1:06 PM  CBC  WBC 4.0 - 10.5 K/uL 5.2  4.2  4.0   Hemoglobin 12.0 - 15.0 g/dL 76.2  26.3  33.5   Hematocrit 36.0 - 46.0 % 36.4  37.4  40.3   Platelets 150 - 400 K/uL 313  336  324        Latest Ref Rng & Units 01/15/2022   11:06  AM 01/01/2021    9:23 AM 11/29/2020   12:20 PM  CMP  Glucose 70 - 99 mg/dL 79  96  85   BUN 6 - 20 mg/dL 6  7  7    Creatinine 0.57 - 1.00 mg/dL  4.56  2.56   Sodium 134 - 144 mmol/L 142  143  141   Potassium 3.5 - 5.2 mmol/L 4.2  4.0  4.3   Chloride 96 - 106 mmol/L 104  106  106  CO2 20 - 29 mmol/L 21  27  25    Calcium 8.7 - 10.2 mg/dL 9.5  9.3  9.1   Total Protein 6.0 - 8.5 g/dL 7.2  7.5  7.2   Total Bilirubin 0.0 - 1.2 mg/dL 0.3  0.3  0.4   Alkaline Phos 44 - 121 IU/L 78  60  68   AST 0 - 40 IU/L 17  16  12    ALT 0 - 32 IU/L 12  14  8       RADIOGRAPHIC STUDIES: No results found.  ASSESSMENT & PLAN Myrella Fahs 21 y.o. female with medical history significant for iron deficiency anemia secondary to GYN bleeding who presents for a follow up visit.   #Iron deficiency anemia 2/2 menstrual bleeding -- Received IV feraheme 510 mg x 2 doses on 05/08/2021-05/15/2021 --Labs today show anemia has resolved wit Hgb 12.3, white blood cell count 5.2, MCV 77.4, and platelets of 313. --No need for additional IV iron.  -- Restart ferrous sulfate 325 mg once daily. Recommend to take with a source of vitamin C --Continue to incorporate iron rich foods into diet.   --Under the care of OB/GYN, not currently on any birth control ---RTC in 6 months with repeat labs   #Sickle Cell Trait: --Hgb electrophoresis from 09/18/2020 confirmed sickle cell trait (heterozygous).  No orders of the defined types were placed in this encounter.   All questions were answered. The patient knows to call the clinic with any problems, questions or concerns.  A total of more than 30 minutes were spent on this encounter with face-to-face time and non-face-to-face time, including preparing to see the patient, ordering tests and/or medications, counseling the patient and coordination of care as outlined above.   14/12/2020, MD Department of Hematology/Oncology [facility] Phone: [phone] Pager: [phone] Email: [email or web address]  05/01/2022 9:19 AM

## 2022-05-30 ENCOUNTER — Ambulatory Visit
Admission: EM | Admit: 2022-05-30 | Discharge: 2022-05-30 | Discharge status: Against Medical Advice | Payer: Medicaid Other

## 2022-06-08 ENCOUNTER — Ambulatory Visit
Admission: RE | Admit: 2022-06-08 | Discharge: 2022-06-08 | Disposition: A | Discharge status: Home / Self Care | Payer: Medicaid Other | Source: Ambulatory Visit | Attending: Internal Medicine | Admitting: Internal Medicine

## 2022-06-08 ENCOUNTER — Ambulatory Visit (INDEPENDENT_AMBULATORY_CARE_PROVIDER_SITE_OTHER): Payer: Medicaid Other

## 2022-06-08 VITALS — BP 120/97 | HR 93 | Temp 98.0°F | Resp 16

## 2022-06-08 DIAGNOSIS — W109XXA Fall (on) (from) unspecified stairs and steps, initial encounter: Secondary | ICD-10-CM

## 2022-06-08 DIAGNOSIS — M25571 Pain in right ankle and joints of right foot: Secondary | ICD-10-CM | POA: Diagnosis not present

## 2022-06-08 NOTE — ED Provider Notes (Signed)
[facility]    CSN: [id number] Arrival date & time: 06/08/22  6578      History   Chief Complaint Chief Complaint  Patient presents with   Ankle Pain    I think I rolled or sprained my ankle, also in need of a Work note. - Entered by patient    HPI Tina Spence is a 22 y.o. female.   Patient presents with pain to right ankle after injury that occurred yesterday.  Patient reports that she was wearing platform heels when she tripped down 3 stairs and rolled her right ankle.  Denies hitting head or losing consciousness.  Patient has taken ibuprofen with improvement in pain.  Reports pain mainly occurs with weightbearing.  Does not report numbness or tingling.   Ankle Pain   Past Medical History:  Diagnosis Date   Anxiety    Sickle cell trait [facility])     Patient Active Problem List   Diagnosis Date Noted   Sickle-cell trait ([place]) 01/15/2022   Tonsillar enlargement 01/15/2022   Recurrent tonsillitis 01/15/2022   Menorrhagia with regular cycle 01/15/2022   Encounter for general counseling and advice on contraceptive management 01/15/2022   Iron deficiency 03/04/2021    Past Surgical History:  Procedure Laterality Date   CYST REMOVAL NECK      OB History   No obstetric history on file.      Home Medications    Prior to Admission medications   Medication Sig Start Date End Date Taking? Authorizing Provider  FEROSUL 325 (65 Fe) MG tablet Take 1 tablet (325 mg total) by mouth every other day. 10/22/21  Yes Shelly Bombard, MD  escitalopram (LEXAPRO) 10 MG tablet Take 10 mg by mouth daily. 02/21/22   [provider]  Prenat-Fe Poly-Methfol-FA-DHA (VITAFOL ULTRA) 29-0.6-0.4-200 MG CAPS Take 1 capsule by mouth daily before breakfast. 10/22/21   Shelly Bombard, MD    Family History Family History  Problem Relation Age of Onset   Anemia Mother    Sickle cell trait Mother    Stroke Father    Hypertension Father    Heart disease Father     Sickle cell trait Sister    Breast cancer Maternal Great-grandmother    Sickle cell anemia Other     Social History Social History   Tobacco Use   Smoking status: Never   Smokeless tobacco: Never  Vaping Use   Vaping Use: Never used  Substance Use Topics   Alcohol use: Not Currently   Drug use: Not Currently     Allergies   Patient has no known allergies.   Review of Systems Review of Systems Per HPI  Physical Exam Triage Vital Signs ED Triage Vitals  Enc Vitals Group     BP 06/08/22 0925 (!) 120/97     Pulse Rate 06/08/22 0925 93     Resp 06/08/22 0925 16     Temp 06/08/22 0925 98 F (36.7 C)     Temp Source 06/08/22 0925 Oral     SpO2 06/08/22 0925 99 %     Weight --      Height --      Head Circumference --      Peak Flow --      Pain Score 06/08/22 0926 3     Pain Loc --      Pain Edu? --      Excl. in [place]? --    No data found.  Updated Vital Signs BP Marland Kitchen)  120/97 (BP Location: Left Arm)   Pulse 93   Temp 98 F (36.7 C) (Oral)   Resp 16   SpO2 99%   Visual Acuity Right Eye Distance:   Left Eye Distance:   Bilateral Distance:    Right Eye Near:   Left Eye Near:    Bilateral Near:     Physical Exam Constitutional:      General: She is not in acute distress.    Appearance: Normal appearance. She is not toxic-appearing or diaphoretic.  HENT:     Head: Normocephalic and atraumatic.  Eyes:     Extraocular Movements: Extraocular movements intact.     Conjunctiva/sclera: Conjunctivae normal.  Pulmonary:     Effort: Pulmonary effort is normal.  Musculoskeletal:     Comments: No tenderness to palpation throughout ankle.  There is some mild swelling to lateral malleolus of right ankle.  No discoloration, lacerations, abrasions noted.  Patient has full range of motion of ankle.  No tenderness to foot or toes.  Neurovascular intact.  Neurological:     General: No focal deficit present.     Mental Status: She is alert and oriented to person, place,  and time. Mental status is at baseline.  Psychiatric:        Mood and Affect: Mood normal.        Behavior: Behavior normal.        Thought Content: Thought content normal.        Judgment: Judgment normal.      UC Treatments / Results  Labs (all labs ordered are listed, but only abnormal results are displayed) Labs Reviewed - No data to display  EKG   Radiology DG Ankle Complete Right  Result Date: 06/08/2022 CLINICAL DATA:  Right ankle injury after falling down stairs yesterday. EXAM: RIGHT ANKLE - COMPLETE 3+ VIEW; RIGHT FOOT COMPLETE - 3+ VIEW COMPARISON:  None Available. FINDINGS: There is no evidence of fracture, dislocation, or joint effusion. The ankle mortise is symmetric. The talar dome is intact. There is no evidence of arthropathy or other focal bone abnormality. Soft tissues are unremarkable. IMPRESSION: 1. Negative.  Normal right foot and ankle x-rays. Electronically Signed   By: Obie Dredge M.D.   On: 06/08/2022 09:52   DG Foot Complete Right  Result Date: 06/08/2022 CLINICAL DATA:  Right ankle injury after falling down stairs yesterday. EXAM: RIGHT ANKLE - COMPLETE 3+ VIEW; RIGHT FOOT COMPLETE - 3+ VIEW COMPARISON:  None Available. FINDINGS: There is no evidence of fracture, dislocation, or joint effusion. The ankle mortise is symmetric. The talar dome is intact. There is no evidence of arthropathy or other focal bone abnormality. Soft tissues are unremarkable. IMPRESSION: 1. Negative.  Normal right foot and ankle x-rays. Electronically Signed   By: Obie Dredge M.D.   On: 06/08/2022 09:52    Procedures Procedures (including critical care time)  Medications Ordered in UC Medications - No data to display  Initial Impression / Assessment and Plan / UC Course  I have reviewed the triage vital signs and the nursing notes.  Pertinent labs & imaging results that were available during my care of the patient were reviewed by me and considered in my medical decision  making (see chart for details).     X-rays were negative for any acute bony abnormality.  Foot x-ray ordered by clinical staff per nursing protocol which was not necessary given no foot tenderness.  Although, both x-rays were negative.  Suspect ankle sprain given mechanism of  injury.  Advised RICE.  Ace wrap applied in urgent care.  Patient advised not to sleep in this.  Discussed over-the-counter pain relievers with patient.  Patient advised to follow-up with provided contact information for orthopedist if symptoms persist or worsen.  Patient verbalized understanding and was agreeable with plan. Final Clinical Impressions(s) / UC Diagnoses   Final diagnoses:  Acute right ankle pain     Discharge Instructions      Your x-rays were normal.  Suspect ankle sprain.  Recommend ice application, elevation of extremity, Ace wrap.  Ace wrap has been applied in urgent care.  Do not sleep in this.  Follow-up with orthopedist at provided contact information if symptoms persist or worsen.     ED Prescriptions   None    PDMP not reviewed this encounter.   Teodora Medici, [place] 06/08/22 1040

## 2022-06-08 NOTE — Discharge Instructions (Signed)
Your x-rays were normal.  Suspect ankle sprain.  Recommend ice application, elevation of extremity, Ace wrap.  Ace wrap has been applied in urgent care.  Do not sleep in this.  Follow-up with orthopedist at provided contact information if symptoms persist or worsen.

## 2022-06-08 NOTE — ED Triage Notes (Signed)
Pt c/o rolling right ankle tripping down stairs yesterday. States today it is mildly swollen and painful when applying pressure.

## 2022-06-17 ENCOUNTER — Encounter: Payer: Self-pay | Admitting: Physician Assistant

## 2022-06-17 ENCOUNTER — Ambulatory Visit: Payer: Medicaid Other | Admitting: Critical Care Medicine

## 2022-06-17 ENCOUNTER — Other Ambulatory Visit: Payer: Self-pay

## 2022-06-17 ENCOUNTER — Ambulatory Visit: Payer: Medicaid Other | Attending: Critical Care Medicine | Admitting: Physician Assistant

## 2022-06-17 VITALS — BP 128/91 | HR 74 | Wt 213.0 lb

## 2022-06-17 DIAGNOSIS — R195 Other fecal abnormalities: Secondary | ICD-10-CM

## 2022-06-17 DIAGNOSIS — K9041 Non-celiac gluten sensitivity: Secondary | ICD-10-CM | POA: Diagnosis not present

## 2022-06-17 DIAGNOSIS — H6993 Unspecified Eustachian tube disorder, bilateral: Secondary | ICD-10-CM | POA: Diagnosis not present

## 2022-06-17 MED ORDER — FLUTICASONE PROPIONATE 50 MCG/ACT NA SUSP
2.0000 | Freq: Every day | NASAL | 6 refills | Status: DC
  Filled 2022-06-17: qty 16, 30d supply, fill #0

## 2022-06-17 NOTE — Progress Notes (Signed)
Patient ID: Tina Spence, female   DOB: 2000-07-07, 22 y.o.   MRN: [id number]     Tina Spence, is a 22 y.o. female  LYY:[id number]  LEX:[id number]  DOB - 04-29-2001  Chief Complaint  Patient presents with   Hip Pain   Dizziness   Ear Pain       Subjective:   Tina Spence is a 22 y.o. female here today for UC on 06/08/2022 for a sprained ankle.  She is doing well from that and is no longer having pain.  She c/o today of on and off dizziness since having the flu in December.  She drinks 3-4 bottles of water daily.  No vision changes.  She is feeling sinus congestion.  She is taking iron daily as directed.  No PICA  She also has intermittent loose stools and thinks she may have intolerance to wheat.  +gas/bloating and loos stools.  This has been going on for a while.     From ED note: X-rays were negative for any acute bony abnormality.  Foot x-ray ordered by clinical staff per nursing protocol which was not necessary given no foot tenderness.  Although, both x-rays were negative.  Suspect ankle sprain given mechanism of injury.  Advised RICE.  Ace wrap applied in urgent care.  Patient advised not to sleep in this.  Discussed over-the-counter pain relievers with patient.  Patient advised to follow-up with provided contact information for orthopedist if symptoms persist or worsen.   Your x-rays were normal. Suspect ankle sprain. Recommend ice application, elevation of extremity, Ace wrap. Ace wrap has been applied in urgent care. Do not sleep in this. Follow-up with orthopedist at provided contact information if symptoms persist or worsen.     No problems updated.  ALLERGIES: No Known Allergies  PAST MEDICAL HISTORY: Past Medical History:  Diagnosis Date   Anxiety    Sickle cell trait ([place])     MEDICATIONS AT HOME: Prior to Admission medications   Medication Sig Start Date End Date Taking? Authorizing Provider  escitalopram (LEXAPRO) 10 MG tablet Take 10 mg by mouth daily.  02/21/22  Yes [provider]  FEROSUL 325 (65 Fe) MG tablet Take 1 tablet (325 mg total) by mouth every other day. 10/22/21  Yes Shelly Bombard, MD  fluticasone (FLONASE) 50 MCG/ACT nasal spray Place 2 sprays into both nostrils daily. 06/17/22  Yes Shaylah Mcghie M, PA-C  Prenat-Fe Poly-Methfol-FA-DHA (VITAFOL ULTRA) 29-0.6-0.4-200 MG CAPS Take 1 capsule by mouth daily before breakfast. 10/22/21  Yes Shelly Bombard, MD    ROS: Neg resp Neg cardiac Neg GI Neg GU Neg MS Neg psych   Objective:   Vitals:   06/17/22 0945  BP: (!) 128/91  Pulse: 74  SpO2: 98%  Weight: 213 lb (96.6 kg)   Exam General appearance : Awake, alert, not in any distress. Speech Clear. Not toxic looking HEENT: Atraumatic and Normocephalic.  B TM congested but no sign of infection.   Throat with PND. Neck: Supple, no JVD. No cervical lymphadenopathy.  Chest: Good air entry bilaterally, CTAB.  No rales/rhonchi/wheezing CVS: S1 S2 regular, no murmurs.  Extremities: B/L Lower Ext shows no edema, both legs are warm to touch Neurology: Awake alert, and oriented X 3, CN II-XII intact, Non focal Skin: No Rash  Data Review Lab Results  Component Value Date   HGBA1C 5.4 01/15/2022    Assessment & Plan   1. Loose stools She wishes to have a work up for celiac -  Ambulatory referral to Gastroenterology  2. Wheat intolerance - Ambulatory referral to Gastroenterology  3. Dysfunction of both eustachian tubes Sudafed or phenyephrine.  Drink 80-100 ounces water daily and continue to adhere to iron regimen - fluticasone (FLONASE) 50 MCG/ACT nasal spray; Place 2 sprays into both nostrils daily.  Dispense: 16 g; Refill: 6    Return if symptoms worsen or fail to improve.  The patient was given clear instructions to go to ER or return to medical center if symptoms don't improve, worsen or new problems develop. The patient verbalized understanding. The patient was told to call to get lab results if they  haven't heard anything in the next week.      Freeman Caldron, PA-C [facility] [place], [place]   06/17/2022, 10:02 AM

## 2022-06-17 NOTE — Patient Instructions (Addendum)
80-100 ounces water Sudafed or phenylephrine  Eustachian Tube Dysfunction  Eustachian tube dysfunction refers to a condition in which a blockage develops in the narrow passage that connects the middle ear to the back of the nose (eustachian tube). The eustachian tube regulates air pressure in the middle ear by letting air move between the ear and nose. It also helps to drain fluid from the middle ear space. Eustachian tube dysfunction can affect one or both ears. When the eustachian tube does not function properly, air pressure, fluid, or both can build up in the middle ear. What are the causes? This condition occurs when the eustachian tube becomes blocked or cannot open normally. Common causes of this condition include: Ear infections. Colds and other infections that affect the nose, mouth, and throat (upper respiratory tract). Allergies. Irritation from cigarette smoke. Irritation from stomach acid coming up into the esophagus (gastroesophageal reflux). The esophagus is the part of the body that moves food from the mouth to the stomach. Sudden changes in air pressure, such as from descending in an airplane or scuba diving. Abnormal growths in the nose or throat, such as: Growths that line the nose (nasal polyps). Abnormal growth of cells (tumors). Enlarged tissue at the back of the throat (adenoids). What increases the risk? You are more likely to develop this condition if: You smoke. You are overweight. You are a child who has: Certain birth defects of the mouth, such as cleft palate. Large tonsils or adenoids. What are the signs or symptoms? Common symptoms of this condition include: A feeling of fullness in the ear. Ear pain. Clicking or popping noises in the ear. Ringing in the ear (tinnitus). Hearing loss. Loss of balance. Dizziness. Symptoms may get worse when the air pressure around you changes, such as when you travel to an area of high elevation, fly on an airplane, or  go scuba diving. How is this diagnosed? This condition may be diagnosed based on: Your symptoms. A physical exam of your ears, nose, and throat. Tests, such as those that measure: The movement of your eardrum. Your hearing (audiometry). How is this treated? Treatment depends on the cause and severity of your condition. In mild cases, you may relieve your symptoms by moving air into your ears. This is called "popping the ears." In more severe cases, or if you have symptoms of fluid in your ears, treatment may include: Medicines to relieve congestion (decongestants). Medicines that treat allergies (antihistamines). Nasal sprays or ear drops that contain medicines that reduce swelling (steroids). A procedure to drain the fluid in your eardrum. In this procedure, a small tube may be placed in the eardrum to: Drain the fluid. Restore the air in the middle ear space. A procedure to insert a balloon device through the nose to inflate the opening of the eustachian tube (balloon dilation). Follow these instructions at home: Lifestyle Do not do any of the following until your health care provider approves: Travel to high altitudes. Fly in airplanes. Work in a Pension scheme manager or room. Scuba dive. Do not use any products that contain nicotine or tobacco. These products include cigarettes, chewing tobacco, and vaping devices, such as e-cigarettes. If you need help quitting, ask your health care provider. Keep your ears dry. Wear fitted earplugs during showering and bathing. Dry your ears completely after. General instructions Take over-the-counter and prescription medicines only as told by your health care provider. Use techniques to help pop your ears as recommended by your health care provider. These may  include: Chewing gum. Yawning. Frequent, forceful swallowing. Closing your mouth, holding your nose closed, and gently blowing as if you are trying to blow air out of your nose. Keep all  follow-up visits. This is important. Contact a health care provider if: Your symptoms do not go away after treatment. Your symptoms come back after treatment. You are unable to pop your ears. You have: A fever. Pain in your ear. Pain in your head or neck. Fluid draining from your ear. Your hearing suddenly changes. You become very dizzy. You lose your balance. Get help right away if: You have a sudden, severe increase in any of your symptoms. Summary Eustachian tube dysfunction refers to a condition in which a blockage develops in the eustachian tube. It can be caused by ear infections, allergies, inhaled irritants, or abnormal growths in the nose or throat. Symptoms may include ear pain or fullness, hearing loss, or ringing in the ears. Mild cases are treated with techniques to unblock the ears, such as yawning or chewing gum. More severe cases are treated with medicines or procedures. This information is not intended to replace advice given to you by your health care provider. Make sure you discuss any questions you have with your health care provider. Document Revised: 07/29/2020 Document Reviewed: 07/29/2020 Elsevier Patient Education  [place].

## 2022-07-07 ENCOUNTER — Encounter: Payer: Self-pay | Admitting: Physician Assistant

## 2022-08-05 ENCOUNTER — Ambulatory Visit: Payer: Medicaid Other | Admitting: Critical Care Medicine

## 2022-08-05 NOTE — Progress Notes (Deleted)
   Established Patient Office Visit  Subjective   Patient ID: Aleaya Trachtman, female    DOB: [birth year]  Age: 22 y.o. MRN: [id number]  No chief complaint on file.   Last seen Joya Gaskins 12/2021 [place] saw 06/2022 Loose stools She wishes to have a work up for celiac - Ambulatory referral to Gastroenterology   2. Wheat intolerance - Ambulatory referral to Gastroenterology   3. Dysfunction of both eustachian tubes Sudafed or phenyephrine.  Drink 80-100 ounces water daily and continue to adhere to iron regimen - fluticasone (FLONASE) 50 MCG/ACT nasal spray; Place 2 sprays into both nostrils daily.  Dispense: 16 g; Refill: 6     {History (Optional):23778}  ROS    Objective:     There were no vitals taken for this visit. {Vitals History (Optional):23777}  Physical Exam   No results found for any visits on 08/05/22.  {Labs (Optional):23779}  The ASCVD Risk score (Arnett DK, et al., 2019) failed to calculate for the following reasons:   The 2019 ASCVD risk score is only valid for ages 15 to 19    Assessment & Plan:   Problem List Items Addressed This Visit   None   No follow-ups on file.    Asencion Noble, MD

## 2022-08-05 NOTE — Progress Notes (Unsigned)
08/06/2022 Tina Spence [id number] 12-May-2001  Referring provider: Argentina Donovan, PA-C Primary GI doctor: Dr. Bryan Lemma  ASSESSMENT AND PLAN:   Iron deficiency With history of menorrhagia most likely this represents patient's dysmenorrhea however with patient's symptoms of diarrhea, abdominal bloating and nausea we will plan for EGD and colonoscopy to evaluate for IBD  will recheck CBC, iron and ferritin.  Loose stools   With abdominal bloating, nausea without vomiting  patient has lactose intolerance, believes she may have gluten allergy versus intolerance  less likely to be gluten allergy such as celiac but will check TTG/IgA, EGD  reviewed FODMAP diet with patient.  Will check CBC, CMET, thyroid, sed rate and CRP  with IDA and current symptoms we will plan for both endoscopy and colonoscopy to evaluate. Risk of bowel prep, conscious sedation, and EGD and colonoscopy were discussed.  Risks include but are not limited to dehydration, pain, bleeding, cardiopulmonary process, bowel perforation, or other possible adverse outcomes..  Treatment plan was discussed with patient, and agreed upon.  can consider SIBO testing of endoscopic evaluation negative    Patient Care Team: Elsie Stain, MD as PCP - General (Pulmonary Disease)  HISTORY OF PRESENT ILLNESS: 22 y.o. female with a past medical history of anxiety and sickle cell trait and others listed below presents as a new patient for evaluation of loose stools, abdominal bloating/gas.    Patient's had iron deficiency anemia since at least 03/2021 when ferritin was 8, Hgb at the time 11.6. Most recently checked 05/01/2022 ferritin was 8.  Iron 41, saturation 12, TIBC 33.9 Patient is on iron supplement, every other day with OJ, MCV 77.4. Menses 1 - 2 times a month, will last for 5-6 days, first 2 days very heavy with 10 pads a day.   She states she has had an issue with bread for years, would have bloating after eating  bread but then toward early Jan she started with diarrhea every day.  She is having a BM 2-3 x a day, , no nocturnal symptoms.  Urgency with the diarrhea, worse after oatmeal, can have some formed stools too.  She states she has no AB pain but can have feeling she needs to go to the bathroom but not have stool, some rectal pressure.  No fecal incontinence.  She is also lactose intolerant, states she will have AB pain if she drinks/eats lactose, no AB pain with this stool.  She has nausea, can be before BM or can be random, denies vomiting.  Denies reflux/GERD/dysphagia.  Denies melena or hematochezia.   She has had rash along her neck/shawl but this resolved, lasted 4 days, left knee pain but no other joint pain.  Denies family history of colon cancer or autoimmune in her family.  Noone sick around her.  She does not smoke.  Rare ETOH on birthdays, once every few months. No drug use.  Will use aleve/ibuprofen for headaches will use twice a week, rare goody powder use.   She  reports that she has never smoked. She has never used smokeless tobacco. She reports that she does not currently use alcohol. She reports that she does not currently use drugs.  RELEVANT LABS AND IMAGING: CBC    Component Value Date/Time   WBC 5.2 05/01/2022 0830   WBC 3.9 (L) 01/01/2021 0923   RBC 4.70 05/01/2022 0830   RBC 4.71 05/01/2022 0829   HGB 12.3 05/01/2022 0830   HGB 12.3 01/15/2022 1106   HCT 36.4  05/01/2022 0830   HCT 37.4 01/15/2022 1106   PLT 313 05/01/2022 0830   PLT 336 01/15/2022 1106   MCV 77.4 (L) 05/01/2022 0830   MCV 83 01/15/2022 1106   MCH 26.2 05/01/2022 0830   MCHC 33.8 05/01/2022 0830   RDW 14.2 05/01/2022 0830   RDW 13.8 01/15/2022 1106   LYMPHSABS 2.5 05/01/2022 0830   LYMPHSABS 1.5 01/15/2022 1106   MONOABS 0.4 05/01/2022 0830   EOSABS 0.2 05/01/2022 0830   EOSABS 0.1 01/15/2022 1106   BASOSABS 0.0 05/01/2022 0830   BASOSABS 0.0 01/15/2022 1106   Recent Labs     09/26/21 1306 01/15/22 1106 05/01/22 0830  HGB 13.4 12.3 12.3     CMP     Component Value Date/Time   NA 138 05/01/2022 0830   NA 142 01/15/2022 1106   K 3.9 05/01/2022 0830   CL 106 05/01/2022 0830   CO2 27 05/01/2022 0830   GLUCOSE 100 (H) 05/01/2022 0830   BUN 10 05/01/2022 0830   BUN 6 01/15/2022 1106   CREATININE 0.73 05/01/2022 0830   CALCIUM 9.4 05/01/2022 0830   PROT 7.1 05/01/2022 0830   PROT 7.2 01/15/2022 1106   ALBUMIN 4.2 05/01/2022 0830   ALBUMIN 4.5 01/15/2022 1106   AST 13 (L) 05/01/2022 0830   ALT 9 05/01/2022 0830   ALKPHOS 68 05/01/2022 0830   BILITOT 0.3 05/01/2022 0830   GFRNONAA >60 05/01/2022 0830      Latest Ref Rng & Units 05/01/2022    8:30 AM 01/15/2022   11:06 AM 01/01/2021    9:23 AM  Hepatic Function  Total Protein 6.5 - 8.1 g/dL 7.1  7.2  7.5   Albumin 3.5 - 5.0 g/dL 4.2  4.5  4.1   AST 15 - 41 U/L '13  17  16   '$ ALT 0 - 44 U/L '9  12  14   '$ Alk Phosphatase 38 - 126 U/L 68  78  60   Total Bilirubin 0.3 - 1.2 mg/dL 0.3  0.3  0.3       Current Medications:       Current Outpatient Medications (Hematological):    FEROSUL 325 (65 Fe) MG tablet, Take 1 tablet (325 mg total) by mouth every other day.  Current Outpatient Medications (Other):    Na Sulfate-K Sulfate-Mg Sulf 17.5-3.13-1.6 GM/177ML SOLN, Use as directed; may use generic; goodrx card if insurance will not cover generic   Prenat-Fe Poly-Methfol-FA-DHA (VITAFOL ULTRA) 29-0.6-0.4-200 MG CAPS, Take 1 capsule by mouth daily before breakfast.  Medical History:  Past Medical History:  Diagnosis Date   Anxiety    Sickle cell trait (HCC)    Allergies: No Known Allergies   Surgical History:  She  has a past surgical history that includes Cyst removal neck. Family History:  Her family history includes Anemia in her mother; Breast cancer in her maternal great-grandmother; Heart disease in her father; Hypertension in her father; Sickle cell anemia in an other family member; Sickle  cell trait in her mother and sister; Stroke in her father.  REVIEW OF SYSTEMS  : All other systems reviewed and negative except where noted in the History of Present Illness.  PHYSICAL EXAM: BP 128/84   Pulse (!) 101   Ht '5\' 6"'$  (1.676 m)   Wt 215 lb (97.5 kg)   SpO2 96%   BMI 34.70 kg/m  General Appearance: Well nourished, in no apparent distress. Head:   Normocephalic and atraumatic. Eyes:  sclerae anicteric,conjunctive pink  Respiratory: Respiratory effort normal, BS equal bilaterally without rales, rhonchi, wheezing. Cardio: RRR with no MRGs. Peripheral pulses intact.  Abdomen: Soft,  Obese ,active bowel sounds. mild tenderness in the epigastrium. Without guarding and Without rebound. No masses. Rectal: Not evaluated Musculoskeletal: Full ROM, Normal gait. Without edema. Skin:  Dry and intact without significant lesions or rashes Neuro: Alert and  oriented x4;  No focal deficits. Psych:  Cooperative. Normal mood and affect.    Vladimir Crofts, PA-C 12:21 PM

## 2022-08-06 ENCOUNTER — Other Ambulatory Visit (INDEPENDENT_AMBULATORY_CARE_PROVIDER_SITE_OTHER): Payer: Medicaid Other

## 2022-08-06 ENCOUNTER — Ambulatory Visit (INDEPENDENT_AMBULATORY_CARE_PROVIDER_SITE_OTHER): Payer: Medicaid Other | Admitting: Physician Assistant

## 2022-08-06 ENCOUNTER — Encounter: Payer: Self-pay | Admitting: Physician Assistant

## 2022-08-06 VITALS — BP 128/84 | HR 101 | Ht 66.0 in | Wt 215.0 lb

## 2022-08-06 DIAGNOSIS — R110 Nausea: Secondary | ICD-10-CM | POA: Diagnosis not present

## 2022-08-06 DIAGNOSIS — R195 Other fecal abnormalities: Secondary | ICD-10-CM | POA: Diagnosis not present

## 2022-08-06 DIAGNOSIS — R140 Abdominal distension (gaseous): Secondary | ICD-10-CM | POA: Diagnosis not present

## 2022-08-06 DIAGNOSIS — N920 Excessive and frequent menstruation with regular cycle: Secondary | ICD-10-CM | POA: Diagnosis not present

## 2022-08-06 DIAGNOSIS — E611 Iron deficiency: Secondary | ICD-10-CM | POA: Diagnosis not present

## 2022-08-06 LAB — CBC WITH DIFFERENTIAL/PLATELET
Basophils Absolute: 0 10*3/uL (ref 0.0–0.1)
Basophils Relative: 0.9 % (ref 0.0–3.0)
Eosinophils Absolute: 0.1 10*3/uL (ref 0.0–0.7)
Eosinophils Relative: 1.7 % (ref 0.0–5.0)
HCT: 38.5 % (ref 36.0–46.0)
Hemoglobin: 12.7 g/dL (ref 12.0–15.0)
Lymphocytes Relative: 36.9 % (ref 12.0–46.0)
Lymphs Abs: 2 10*3/uL (ref 0.7–4.0)
MCHC: 32.9 g/dL (ref 30.0–36.0)
MCV: 80.1 fl (ref 78.0–100.0)
Monocytes Absolute: 0.5 10*3/uL (ref 0.1–1.0)
Monocytes Relative: 10.1 % (ref 3.0–12.0)
Neutro Abs: 2.7 10*3/uL (ref 1.4–7.7)
Neutrophils Relative %: 50.4 % (ref 43.0–77.0)
Platelets: 319 10*3/uL (ref 150.0–400.0)
RBC: 4.81 Mil/uL (ref 3.87–5.11)
RDW: 15.1 % (ref 11.5–15.5)
WBC: 5.3 10*3/uL (ref 4.0–10.5)

## 2022-08-06 LAB — COMPREHENSIVE METABOLIC PANEL
ALT: 11 U/L (ref 0–35)
AST: 14 U/L (ref 0–37)
Albumin: 4 g/dL (ref 3.5–5.2)
Alkaline Phosphatase: 67 U/L (ref 39–117)
BUN: 8 mg/dL (ref 6–23)
CO2: 25 mEq/L (ref 19–32)
Calcium: 9.5 mg/dL (ref 8.4–10.5)
Chloride: 107 mEq/L (ref 96–112)
Creatinine, Ser: 0.87 mg/dL (ref 0.40–1.20)
GFR: 94.93 mL/min (ref >60.00)
Glucose, Bld: 82 mg/dL (ref 70–99)
Potassium: 3.8 mEq/L (ref 3.5–5.1)
Sodium: 139 mEq/L (ref 135–145)
Total Bilirubin: 0.4 mg/dL (ref 0.2–1.2)
Total Protein: 7 g/dL (ref 6.0–8.3)

## 2022-08-06 LAB — IBC + FERRITIN
Ferritin: 12.9 ng/mL (ref 10.0–291.0)
Iron: 39 ug/dL — ABNORMAL LOW (ref 42–145)
Saturation Ratios: 11.5 % — ABNORMAL LOW (ref 20.0–50.0)
TIBC: 338.8 ug/dL (ref 250.0–450.0)
Transferrin: 242 mg/dL (ref 212.0–360.0)

## 2022-08-06 LAB — TSH: TSH: 2.29 u[IU]/mL (ref 0.35–5.50)

## 2022-08-06 LAB — HIGH SENSITIVITY CRP: CRP, High Sensitivity: 1.88 mg/L (ref 0.000–5.000)

## 2022-08-06 LAB — SEDIMENTATION RATE: Sed Rate: 31 mm/hr — ABNORMAL HIGH (ref 0–20)

## 2022-08-06 MED ORDER — NA SULFATE-K SULFATE-MG SULF 17.5-3.13-1.6 GM/177ML PO SOLN: ORAL | 0 refills | Status: DC

## 2022-08-06 NOTE — Patient Instructions (Addendum)
You have been scheduled for an endoscopy and colonoscopy. Please follow the written instructions given to you at your visit today. Please pick up your prep supplies at the pharmacy within the next 1-3 days. If you use inhalers (even only as needed), please bring them with you on the day of your procedure.   Your provider has requested that you go to the basement level for lab work before leaving today. Press "B" on the elevator. The lab is located at the first door on the left as you exit the elevator.  If your blood pressure at your visit was 140/90 or greater, please contact your primary care physician to follow up on this.  _______________________________________________________  If you are age 22 or older, your body mass index should be between 23-30. Your Body mass index is 34.7 kg/m. If this is out of the aforementioned range listed, please consider follow up with your Primary Care Provider.  If you are age 22 or younger, your body mass index should be between 19-25. Your Body mass index is 34.7 kg/m. If this is out of the aformentioned range listed, please consider follow up with your Primary Care Provider.   The [place] [facility] providers would like to encourage you to use [facility] to communicate with providers for non-urgent requests or questions.  Due to long hold times on the telephone, sending your provider a message by [facility] may be a faster and more efficient way to get a response.  Please allow 48 business hours for a response.  Please remember that this is for non-urgent requests.  Due to recent changes in healthcare laws, you may see the results of your imaging and laboratory studies on MyChart before your provider has had a chance to review them.  We understand that in some cases there may be results that are confusing or concerning to you. Not all laboratory results come back in the same time frame and the provider may be waiting for multiple results in order to interpret others.   Please give [place] 48 hours in order for your provider to thoroughly review all the results before contacting the office for clarification of your results.     Add fiber like benefiber or citracel once a day Increase activity  FIBER SUPPLEMENT You can do metamucil or fibercon once or twice a day but if this causes gas/bloating please switch to Benefiber or Citracel.  Fiber is good for constipation/diarrhea/irritable bowel syndrome.  It can also help with weight loss and can help lower your bad cholesterol (LDL).  Please do 1 TBSP in the morning in water, coffee, or tea.  It can take up to a month before you can see a difference with your bowel movements.  It is cheapest from [employer], [employer], [employer].    Please try low FODMAP diet- see below- start with eliminating just one column at a time, the table at the very bottom contains foods that are safe to take   FODMAP stands for fermentable oligo-, di-, mono-saccharides and polyols (1). These are the scientific terms used to classify groups of carbs that are notorious for triggering digestive symptoms like bloating, gas and stomach pain.     Thank you for entrusting me with your care and choosing [facility].  Vicie Mutters PA-C

## 2022-08-07 LAB — IGA: Immunoglobulin A: 153 mg/dL (ref 47–310)

## 2022-08-07 LAB — TISSUE TRANSGLUTAMINASE, IGA: (tTG) Ab, IgA: <1 U/mL

## 2022-08-07 NOTE — Progress Notes (Signed)
Agree with the assessment and plan as outlined by Amanda Collier, PA-C. ? ?Francella Barnett, DO, FACG ? ?

## 2022-08-31 DIAGNOSIS — K2970 Gastritis, unspecified, without bleeding: Secondary | ICD-10-CM

## 2022-08-31 HISTORY — DX: Gastritis, unspecified, without bleeding: K29.70

## 2022-09-14 ENCOUNTER — Other Ambulatory Visit: Payer: Self-pay

## 2022-09-14 MED ORDER — NA SULFATE-K SULFATE-MG SULF 17.5-3.13-1.6 GM/177ML PO SOLN: ORAL | 0 refills | Status: DC

## 2022-09-14 NOTE — Telephone Encounter (Signed)
Refill med/ error with insurance

## 2022-09-16 ENCOUNTER — Other Ambulatory Visit: Payer: Self-pay

## 2022-09-16 MED ORDER — NA SULFATE-K SULFATE-MG SULF 17.5-3.13-1.6 GM/177ML PO SOLN: ORAL | 0 refills | Status: DC

## 2022-09-16 NOTE — Progress Notes (Signed)
Script resent to [facility]

## 2022-09-21 ENCOUNTER — Encounter: Payer: Self-pay | Admitting: Gastroenterology

## 2022-09-21 ENCOUNTER — Ambulatory Visit (AMBULATORY_SURGERY_CENTER): Payer: Medicaid Other | Admitting: Gastroenterology

## 2022-09-21 VITALS — BP 148/90 | HR 71 | Temp 98.0°F | Resp 16 | Ht 66.0 in | Wt 215.0 lb

## 2022-09-21 DIAGNOSIS — R110 Nausea: Secondary | ICD-10-CM

## 2022-09-21 DIAGNOSIS — D509 Iron deficiency anemia, unspecified: Secondary | ICD-10-CM | POA: Diagnosis not present

## 2022-09-21 DIAGNOSIS — D122 Benign neoplasm of ascending colon: Secondary | ICD-10-CM

## 2022-09-21 DIAGNOSIS — K5140 Inflammatory polyps of colon without complications: Secondary | ICD-10-CM | POA: Diagnosis not present

## 2022-09-21 DIAGNOSIS — R195 Other fecal abnormalities: Secondary | ICD-10-CM | POA: Diagnosis not present

## 2022-09-21 DIAGNOSIS — R140 Abdominal distension (gaseous): Secondary | ICD-10-CM

## 2022-09-21 DIAGNOSIS — R194 Change in bowel habit: Secondary | ICD-10-CM

## 2022-09-21 MED ORDER — SODIUM CHLORIDE 0.9 % IV SOLN
500.0000 mL | Freq: Once | INTRAVENOUS | Status: DC
Start: 2022-09-21 — End: 2022-09-21

## 2022-09-21 NOTE — Patient Instructions (Signed)
Upper Endoscopy :  Biopsies done - exam was normal   Colonoscopy:  Colon polyp was removed   Await pathology results of polyp removed & of biopsies done   Resume usual medications & diet  Use fiber such as Metamucil,Citrucel,Konsyl,or FiberCon   (over the counter, as directed)    YOU HAD AN ENDOSCOPIC PROCEDURE TODAY AT THE [facility]:   Refer to the procedure report that was given to you for any specific questions about what was found during the examination.  If the procedure report does not answer your questions, please call your gastroenterologist to clarify.  If you requested that your care partner not be given the details of your procedure findings, then the procedure report has been included in a sealed envelope for you to review at your convenience later.  YOU SHOULD EXPECT: Some feelings of bloating in the abdomen. Passage of more gas than usual.  Walking can help get rid of the air that was put into your GI tract during the procedure and reduce the bloating. If you had a lower endoscopy (such as a colonoscopy or flexible sigmoidoscopy) you may notice spotting of blood in your stool or on the toilet paper. If you underwent a bowel prep for your procedure, you may not have a normal bowel movement for a few days.  Please Note:  You might notice some irritation and congestion in your nose or some drainage.  This is from the oxygen used during your procedure.  There is no need for concern and it should clear up in a day or so.  SYMPTOMS TO REPORT IMMEDIATELY:  Following lower endoscopy (colonoscopy or flexible sigmoidoscopy):  Excessive amounts of blood in the stool  Significant tenderness or worsening of abdominal pains  Swelling of the abdomen that is new, acute  Fever of 100F or higher  Following upper endoscopy (EGD)  Vomiting of blood or coffee ground material  New chest pain or pain under the shoulder blades  Painful or persistently difficult  swallowing  New shortness of breath  Fever of 100F or higher  Black, tarry-looking stools  For urgent or emergent issues, a gastroenterologist can be reached at any hour by calling [phone]. Do not use MyChart messaging for urgent concerns.    DIET:  We do recommend a small meal at first, but then you may proceed to your regular diet.  Drink plenty of fluids but you should avoid alcoholic beverages for 24 hours.  ACTIVITY:  You should plan to take it easy for the rest of today and you should NOT DRIVE or use heavy machinery until tomorrow (because of the sedation medicines used during the test).    FOLLOW UP: Our staff will call the number listed on your records the next business day following your procedure.  We will call around 7:15- 8:00 am to check on you and address any questions or concerns that you may have regarding the information given to you following your procedure. If we do not reach you, we will leave a message.     If any biopsies were taken you will be contacted by phone or by letter within the next 1-3 weeks.  Please call us at [phone] if you have not heard about the biopsies in 3 weeks.    SIGNATURES/CONFIDENTIALITY: You and/or your care partner have signed paperwork which will be entered into your electronic medical record.  These signatures attest to the fact that that the information above on your After  Visit Summary has been reviewed and is understood.  Full responsibility of the confidentiality of this discharge information lies with you and/or your care-partner.

## 2022-09-21 NOTE — Progress Notes (Signed)
Called to room to assist during endoscopic procedure.  Patient ID and intended procedure confirmed with present staff. Received instructions for my participation in the procedure from the performing physician.  

## 2022-09-21 NOTE — Progress Notes (Signed)
GASTROENTEROLOGY PROCEDURE H&P NOTE   Primary Care Physician: Storm Frisk, MD    Reason for Procedure:  Iron deficiency anemia, diarrhea, bloating, nausea, change in bowel habits  Plan:    EGD, colonoscopy  Patient is appropriate for endoscopic procedure(s) in the ambulatory ([facility]) setting.  The nature of the procedure, as well as the risks, benefits, and alternatives were carefully and thoroughly reviewed with the patient. Ample time for discussion and questions allowed. The patient understood, was satisfied, and agreed to proceed.     HPI: Tina Spence is a 22 y.o. female who presents for EGD and colonoscopy for evaluation of iron deficiency anemia, diarrhea, bloating, nausea, change in bowel habits.  Past Medical History:  Diagnosis Date   Anxiety    Sickle cell trait     Past Surgical History:  Procedure Laterality Date   CYST REMOVAL NECK      Prior to Admission medications   Medication Sig Start Date End Date Taking? Authorizing Provider  FEROSUL 325 (65 Fe) MG tablet Take 1 tablet (325 mg total) by mouth every other day. 10/22/21   Brock Bad, MD  Prenat-Fe Poly-Methfol-FA-DHA (VITAFOL ULTRA) 29-0.6-0.4-200 MG CAPS Take 1 capsule by mouth daily before breakfast. 10/22/21   Brock Bad, MD    Current Outpatient Medications  Medication Sig Dispense Refill   FEROSUL 325 (65 Fe) MG tablet Take 1 tablet (325 mg total) by mouth every other day. 30 tablet 6   Prenat-Fe Poly-Methfol-FA-DHA (VITAFOL ULTRA) 29-0.6-0.4-200 MG CAPS Take 1 capsule by mouth daily before breakfast. 90 capsule 4   Current Facility-Administered Medications  Medication Dose Route Frequency Provider Last Rate Last Admin   0.9 %  sodium chloride infusion  500 mL Intravenous Once Akshara Blumenthal V, DO        Allergies as of 09/21/2022   (No Known Allergies)    Family History  Problem Relation Age of Onset   Anemia Mother    Sickle cell trait Mother    Stroke Father     Hypertension Father    Heart disease Father    Sickle cell trait Sister    Breast cancer Maternal Great-grandmother    Sickle cell anemia Other    Colon cancer Neg Hx    Esophageal cancer Neg Hx    Colon polyps Neg Hx    Pancreatic cancer Neg Hx    Stomach cancer Neg Hx     Social History   Socioeconomic History   Marital status: Single    Spouse name: Not on file   Number of children: Not on file   Years of education: Not on file   Highest education level: Not on file  Occupational History   Not on file  Tobacco Use   Smoking status: Never   Smokeless tobacco: Never  Vaping Use   Vaping Use: Never used  Substance and Sexual Activity   Alcohol use: Not Currently    Comment: occ   Drug use: Not Currently   Sexual activity: Yes    Birth control/protection: Pill, Condom  Other Topics Concern   Not on file  Social History Narrative   Not on file   Social Determinants of Health   Financial Resource Strain: Not on file  Food Insecurity: Not on file  Transportation Needs: Not on file  Physical Activity: Not on file  Stress: Not on file  Social Connections: Not on file  Intimate Partner Violence: Not on file    Physical Exam: Vital  signs in last 24 hours:  135/66 (BP Location: Right Arm, Patient Position: Sitting, Cuff Size: Normal)   Pulse 81   Temp 98 F (36.7 C) (Temporal)   Ht  (1.676 m)   Wt 215 lb (97.5 kg)   SpO2 98%   BMI 34.70 kg/m  GEN: NAD EYE: Sclerae anicteric ENT: MMM CV: Non-tachycardic Pulm: CTA b/l GI: Soft, NT/ND NEURO:  Alert & Oriented x 3   Doristine Locks, DO [facility]   09/21/2022 2:30 PM

## 2022-09-21 NOTE — Op Note (Signed)
[facility] Patient Name: Tina Spence Procedure Date: 09/21/2022 2:29 PM MRN: [id number] Endoscopist: Doristine Locks , MD, [id number] Age: 22 Referring MD:  Date of Birth: 11-Sep-2000 Gender: Female Account #: [id number] Procedure:                Colonoscopy with snare polypectomy and biopsy Indications:              Iron deficiency anemia, Change in bowel habits,                            Diarrhea Medicines:                Monitored Anesthesia Care Procedure:                Pre-Anesthesia Assessment:                           - Prior to the procedure, a History and Physical                            was performed, and patient medications and                            allergies were reviewed. The patient's tolerance of                            previous anesthesia was also reviewed. The risks                            and benefits of the procedure and the sedation                            options and risks were discussed with the patient.                            All questions were answered, and informed consent                            was obtained. Prior Anticoagulants: The patient has                            taken no anticoagulant or antiplatelet agents. ASA                            Grade Assessment: II - A patient with mild systemic                            disease. After reviewing the risks and benefits,                            the patient was deemed in satisfactory condition to                            undergo the procedure.  After obtaining informed consent, the colonoscope                            was passed under direct vision. Throughout the                            procedure, the patient's blood pressure, pulse, and                            oxygen saturations were monitored continuously. The                            CF [id number] #[id number] was introduced through the anus                            and advanced  to the the terminal ileum. The                            colonoscopy was performed without difficulty. The                            patient tolerated the procedure well. The quality                            of the bowel preparation was good. The terminal                            ileum, ileocecal valve, appendiceal orifice, and                            rectum were photographed. Scope In: 2:48:23 PM Scope Out: 3:02:16 PM Scope Withdrawal Time: 0 hours 10 minutes 58 seconds  Total Procedure Duration: 0 hours 13 minutes 53 seconds  Findings:                 The perianal and digital rectal examinations were                            normal.                           An 8 mm polyp was found in the ascending colon. The                            polyp was sessile. The polyp was removed with a                            cold snare. Resection and retrieval were complete.                            Estimated blood loss was minimal.                           Normal mucosa was found in the entire colon.  Biopsies for histology were taken with a cold                            forceps from the right colon and left colon for                            evaluation of microscopic colitis. Estimated blood                            loss was minimal.                           The retroflexed view of the distal rectum and anal                            verge was normal and showed no anal or rectal                            abnormalities.                           The terminal ileum appeared normal. Complications:            No immediate complications. Estimated Blood Loss:     Estimated blood loss was minimal. Impression:               - One 8 mm polyp in the ascending colon, removed                            with a cold snare. Resected and retrieved.                           - Normal mucosa in the entire examined colon.                            Biopsied.                            - The distal rectum and anal verge are normal on                            retroflexion view.                           - The examined portion of the ileum was normal. Recommendation:           - Patient has a contact number available for                            emergencies. The signs and symptoms of potential                            delayed complications were discussed with the                            patient.  Return to normal activities tomorrow.                            Written discharge instructions were provided to the                            patient.                           - Resume previous diet.                           - Continue present medications.                           - Await pathology results.                           - Repeat colonoscopy in 5 years for surveillance,                            or potentially sooner based on pathology results.                           - Return to GI office PRN.                           - Use fiber, for example Citrucel, Fibercon, Konsyl                            or Metamucil. Doristine Locks, MD 09/21/2022 3:12:25 PM

## 2022-09-21 NOTE — Op Note (Signed)
[facility] Patient Name: Tina Spence Procedure Date: 09/21/2022 2:30 PM MRN: [id number] Endoscopist: Doristine Locks , MD, [id number] Age: 22 Referring MD:  Date of Birth: August 05, 2000 Gender: Female Account #: [id number] Procedure:                Upper GI endoscopy Indications:              Iron deficiency anemia, Abdominal bloating, Change                            in bowel habits, Diarrhea, Nausea Medicines:                Monitored Anesthesia Care Procedure:                Pre-Anesthesia Assessment:                           - Prior to the procedure, a History and Physical                            was performed, and patient medications and                            allergies were reviewed. The patient's tolerance of                            previous anesthesia was also reviewed. The risks                            and benefits of the procedure and the sedation                            options and risks were discussed with the patient.                            All questions were answered, and informed consent                            was obtained. Prior Anticoagulants: The patient has                            taken no anticoagulant or antiplatelet agents. ASA                            Grade Assessment: II - A patient with mild systemic                            disease. After reviewing the risks and benefits,                            the patient was deemed in satisfactory condition to                            undergo the procedure.  After obtaining informed consent, the endoscope was                            passed under direct vision. Throughout the                            procedure, the patient's blood pressure, pulse, and                            oxygen saturations were monitored continuously. The                            Olympus Scope [id number] was introduced through the                            mouth, and  advanced to the third part of duodenum.                            The upper GI endoscopy was accomplished without                            difficulty. The patient tolerated the procedure                            well. Scope In: Scope Out: Findings:                 The examined esophagus was normal.                           The entire examined stomach was normal. Biopsies                            were taken with a cold forceps for histology and                            Helicobacter pylori testing. Estimated blood loss                            was minimal.                           The examined duodenum was normal. Biopsies were                            taken with a cold forceps for histology. Estimated                            blood loss was minimal. Complications:            No immediate complications. Estimated Blood Loss:     Estimated blood loss was minimal. Impression:               - Normal esophagus.                           -  Normal stomach. Biopsied.                           - Normal examined duodenum. Biopsied. Recommendation:           - Patient has a contact number available for                            emergencies. The signs and symptoms of potential                            delayed complications were discussed with the                            patient. Return to normal activities tomorrow.                            Written discharge instructions were provided to the                            patient.                           - Resume previous diet.                           - Continue present medications.                           - Await pathology results. Doristine Locks, MD 09/21/2022 3:09:14 PM

## 2022-09-21 NOTE — Progress Notes (Signed)
Report to PACU, RN, vss, BBS= Clear.  

## 2022-09-21 NOTE — Progress Notes (Signed)
Vitals-DT  Pt's states no medical or surgical changes since previsit or office visit.  

## 2022-09-22 ENCOUNTER — Telehealth: Payer: Self-pay | Admitting: *Deleted

## 2022-09-22 NOTE — Telephone Encounter (Signed)
Post procedure follow up phone call. No answer at number given.  Left message on voicemail.  

## 2022-10-06 ENCOUNTER — Ambulatory Visit (INDEPENDENT_AMBULATORY_CARE_PROVIDER_SITE_OTHER): Payer: 59 | Admitting: Obstetrics

## 2022-10-06 ENCOUNTER — Encounter: Payer: Self-pay | Admitting: Obstetrics

## 2022-10-06 VITALS — BP 122/83 | HR 69 | Ht 65.0 in | Wt 221.1 lb

## 2022-10-06 DIAGNOSIS — Z30011 Encounter for initial prescription of contraceptive pills: Secondary | ICD-10-CM | POA: Diagnosis not present

## 2022-10-06 DIAGNOSIS — Z3009 Encounter for other general counseling and advice on contraception: Secondary | ICD-10-CM

## 2022-10-06 DIAGNOSIS — N939 Abnormal uterine and vaginal bleeding, unspecified: Secondary | ICD-10-CM | POA: Diagnosis not present

## 2022-10-06 DIAGNOSIS — F419 Anxiety disorder, unspecified: Secondary | ICD-10-CM

## 2022-10-06 DIAGNOSIS — E669 Obesity, unspecified: Secondary | ICD-10-CM

## 2022-10-06 DIAGNOSIS — N943 Premenstrual tension syndrome: Secondary | ICD-10-CM

## 2022-10-06 LAB — POCT URINE PREGNANCY: Preg Test, Ur: NEGATIVE

## 2022-10-06 MED ORDER — PAROXETINE HCL 10 MG PO TABS
10.0000 mg | ORAL_TABLET | Freq: Every day | ORAL | 11 refills | Status: DC
Start: 2022-10-06 — End: 2023-07-14

## 2022-10-06 MED ORDER — SLYND 4 MG PO TABS
1.0000 | ORAL_TABLET | Freq: Every day | ORAL | 11 refills | Status: DC
Start: 2022-10-06 — End: 2023-03-29

## 2022-10-06 NOTE — Progress Notes (Signed)
Patient ID: Tina Spence, female   DOB: May 05, 2001, 22 y.o.   MRN: [id number]  Chief Complaint  Patient presents with   GYN    HPI Tina Spence is a 22 y.o. female.  Complains of mood swings and increased anxiety around period time.  Periods are heavy 6-7 day cycles.  Also concerned about weight gain.   HPI  Past Medical History:  Diagnosis Date   Anxiety    Gastritis 08/2022   Sickle cell trait (HCC)     Past Surgical History:  Procedure Laterality Date   CYST REMOVAL NECK      Family History  Problem Relation Age of Onset   Anemia Mother    Sickle cell trait Mother    Stroke Father    Hypertension Father    Heart disease Father    Sickle cell trait Sister    Breast cancer Maternal Great-grandmother    Sickle cell anemia Other    Colon cancer Neg Hx    Esophageal cancer Neg Hx    Colon polyps Neg Hx    Pancreatic cancer Neg Hx    Stomach cancer Neg Hx     Social History Social History   Tobacco Use   Smoking status: Never   Smokeless tobacco: Never  Vaping Use   Vaping Use: Never used  Substance Use Topics   Alcohol use: Not Currently    Comment: occ   Drug use: Not Currently    No Known Allergies  Current Outpatient Medications  Medication Sig Dispense Refill   Drospirenone (SLYND) 4 MG TABS Take 1 tablet (4 mg total) by mouth daily before breakfast. 28 tablet 11   FEROSUL 325 (65 Fe) MG tablet Take 1 tablet (325 mg total) by mouth every other day. 30 tablet 6   PARoxetine (PAXIL) 10 MG tablet Take 1 tablet (10 mg total) by mouth daily. 30 tablet 11   Prenat-Fe Poly-Methfol-FA-DHA (VITAFOL ULTRA) 29-0.6-0.4-200 MG CAPS Take 1 capsule by mouth daily before breakfast. 90 capsule 4   No current facility-administered medications for this visit.    Review of Systems Review of Systems Constitutional: negative for fatigue and weight loss Respiratory: negative for cough and wheezing Cardiovascular: negative for chest pain, fatigue and  palpitations Gastrointestinal: negative for abdominal pain and change in bowel habits Genitourinary:positive for PMS and heavy 6-7 day periods Integument/breast: negative for nipple discharge Musculoskeletal:negative for myalgias Neurological: negative for gait problems and tremors Behavioral/Psych: positive for anxiety/depression Endocrine: negative for temperature intolerance      Blood pressure 122/83, pulse 69, height 5\' 5"  (1.651 m), weight 221 lb 1.6 oz (100.3 kg), last menstrual period 10/05/2022.  Physical Exam Physical Exam General:   Alert and no distress  Skin:   no rash or abnormalities  Lungs:   clear to auscultation bilaterally  Heart:   regular rate and rhythm, S1, S2 normal, no murmur, click, rub or gallop  Pelvic exam:  Deferred   I have spent a total of 15 minutes of face-to-face time, excluding clinical staff time, reviewing notes and preparing to see patient, ordering tests and/or medications, and counseling the patient.   Data Reviewed Labs  Assessment     1. PMS (premenstrual syndrome) - decrease caffeine intake - PARoxetine (PAXIL) 10 MG tablet; Take 1 tablet (10 mg total) by mouth daily.  Dispense: 30 tablet; Refill: 11  2. Anxiety Rx: - PARoxetine (PAXIL) 10 MG tablet; Take 1 tablet (10 mg total) by mouth daily.  Dispense: 30 tablet; Refill: 11  3. Abnormal uterine bleeding (AUB) Rx: - Drospirenone (SLYND) 4 MG TABS; Take 1 tablet (4 mg total) by mouth daily before breakfast.  Dispense: 28 tablet; Refill: 11  4. Encounter for other general counseling or advice on contraception - options discussed, and a progestin-only pill recommended with history of anxiety/depression and heavy periods  5. Encounter for initial prescription of contraceptive pills - progestin-only pill recommended, with her history of anxiety/depression Rx: - POCT urine pregnancy:  Negative - Drospirenone (SLYND) 4 MG TABS; Take 1 tablet (4 mg total) by mouth daily before  breakfast.  Dispense: 28 tablet; Refill: 11  6. Obesity (BMI 35.0-39.9 without comorbidity) - weight reduction with the aid of dietary changes and exercise recommended    Plan   Follow up in 3 months  Orders Placed This Encounter  Procedures   POCT urine pregnancy   Meds ordered this encounter  Medications   PARoxetine (PAXIL) 10 MG tablet    Sig: Take 1 tablet (10 mg total) by mouth daily.    Dispense:  30 tablet    Refill:  11   Drospirenone (SLYND) 4 MG TABS    Sig: Take 1 tablet (4 mg total) by mouth daily before breakfast.    Dispense:  28 tablet    Refill:  11     Brock Bad, MD 10/06/2022 10:08 AM

## 2022-10-06 NOTE — Progress Notes (Signed)
Pt is in the office for GYN visit. Reports panic attacks/mood swings about 1 week before menstrual cycle begins. LMP 10/05/22 Rpt cycles use to be every 25 days and are now every 32 days. Pt reports that the mood swings are now affecting her work due to her feeling angry and unable to control it, last panic attack was in Feb. Rpts hx of panic attacks and anxiety Rpts seeing a counselor and being on medication in the past PhQ-9= 9 GAD-7= 11 Pt is currently on menstrual cycle and would like to defer pap

## 2022-11-06 ENCOUNTER — Other Ambulatory Visit: Payer: Self-pay

## 2022-11-06 ENCOUNTER — Inpatient Hospital Stay: Payer: 59 | Attending: Hematology and Oncology

## 2022-11-06 DIAGNOSIS — D500 Iron deficiency anemia secondary to blood loss (chronic): Secondary | ICD-10-CM | POA: Insufficient documentation

## 2022-11-06 DIAGNOSIS — N920 Excessive and frequent menstruation with regular cycle: Secondary | ICD-10-CM | POA: Insufficient documentation

## 2022-11-06 LAB — CBC WITH DIFFERENTIAL (CANCER CENTER ONLY)
Abs Immature Granulocytes: 0.01 10*3/uL (ref 0.00–0.07)
Basophils Absolute: 0 10*3/uL (ref 0.0–0.1)
Basophils Relative: 1 %
Eosinophils Absolute: 0.1 10*3/uL (ref 0.0–0.5)
Eosinophils Relative: 2 %
HCT: 38.5 % (ref 36.0–46.0)
Hemoglobin: 12.4 g/dL (ref 12.0–15.0)
Immature Granulocytes: 0 %
Lymphocytes Relative: 49 %
Lymphs Abs: 2.1 10*3/uL (ref 0.7–4.0)
MCH: 24.8 pg — ABNORMAL LOW (ref 26.0–34.0)
MCHC: 32.2 g/dL (ref 30.0–36.0)
MCV: 77.2 fL — ABNORMAL LOW (ref 80.0–100.0)
Monocytes Absolute: 0.3 10*3/uL (ref 0.1–1.0)
Monocytes Relative: 8 %
Neutro Abs: 1.7 10*3/uL (ref 1.7–7.7)
Neutrophils Relative %: 40 %
Platelet Count: 324 10*3/uL (ref 150–400)
RBC: 4.99 MIL/uL (ref 3.87–5.11)
RDW: 15 % (ref 11.5–15.5)
WBC Count: 4.2 10*3/uL (ref 4.0–10.5)
nRBC: 0 % (ref 0.0–0.2)

## 2022-11-06 LAB — CMP (CANCER CENTER ONLY)
ALT: 11 U/L (ref 0–44)
AST: 14 U/L — ABNORMAL LOW (ref 15–41)
Albumin: 4.2 g/dL (ref 3.5–5.0)
Alkaline Phosphatase: 68 U/L (ref 38–126)
Anion gap: 5 (ref 5–15)
BUN: 8 mg/dL (ref 6–20)
CO2: 27 mmol/L (ref 22–32)
Calcium: 9.3 mg/dL (ref 8.9–10.3)
Chloride: 106 mmol/L (ref 98–111)
Creatinine: 0.89 mg/dL (ref 0.44–1.00)
GFR, Estimated: >60 mL/min (ref >60)
Glucose, Bld: 108 mg/dL — ABNORMAL HIGH (ref 70–99)
Potassium: 3.8 mmol/L (ref 3.5–5.1)
Sodium: 138 mmol/L (ref 135–145)
Total Bilirubin: 0.5 mg/dL (ref 0.3–1.2)
Total Protein: 7.3 g/dL (ref 6.5–8.1)

## 2022-11-06 LAB — IRON AND IRON BINDING CAPACITY (CC-WL,HP ONLY)
Iron: 77 ug/dL (ref 28–170)
Saturation Ratios: 21 % (ref 10.4–31.8)
TIBC: 360 ug/dL (ref 250–450)
UIBC: 283 ug/dL (ref 148–442)

## 2022-11-06 LAB — RETIC PANEL
Immature Retic Fract: 14.7 % (ref 2.3–15.9)
RBC.: 4.87 MIL/uL (ref 3.87–5.11)
Retic Count, Absolute: 61.4 10*3/uL (ref 19.0–186.0)
Retic Ct Pct: 1.3 % (ref 0.4–3.1)
Reticulocyte Hemoglobin: 28.2 pg (ref >27.9)

## 2022-11-06 LAB — FERRITIN: Ferritin: 8 ng/mL — ABNORMAL LOW (ref 11–307)

## 2022-11-13 ENCOUNTER — Inpatient Hospital Stay (HOSPITAL_BASED_OUTPATIENT_CLINIC_OR_DEPARTMENT_OTHER): Payer: 59 | Admitting: Hematology and Oncology

## 2022-11-13 ENCOUNTER — Other Ambulatory Visit: Payer: Self-pay

## 2022-11-13 VITALS — BP 128/94 | HR 69 | Temp 97.9°F | Resp 18 | Ht 65.0 in | Wt 217.4 lb

## 2022-11-13 DIAGNOSIS — D500 Iron deficiency anemia secondary to blood loss (chronic): Secondary | ICD-10-CM

## 2022-11-13 DIAGNOSIS — N920 Excessive and frequent menstruation with regular cycle: Secondary | ICD-10-CM | POA: Diagnosis not present

## 2022-11-13 DIAGNOSIS — E611 Iron deficiency: Secondary | ICD-10-CM

## 2022-11-13 NOTE — Progress Notes (Signed)
[facility] [phone]   [phone]  PROGRESS NOTE  Patient Care Team: Storm Frisk, MD as PCP - General (Pulmonary Disease)  Hematological/Oncological History # Iron Deficiency Anemia 2/2 to GYN Bleeding. 1) Labs from PCP, [name] from [facility] -08/15/2020: WBC 5.8, Hgb 11.6, MCV 73 (L), Plt 404 -09/18/2020: Iron 14 (L), TIBC 408 (H), Ferritin 3.50 (L). Hgb electrophoresis confirmed sickle cell trait (heterozygous).  -11/05/2020: WBC 5.5, Hgb 12.0, MCV 75 (L), Plt 353. 2) 11/29/2020: Establish care with [name] PA-C 3) 05/08/2021-05/15/2021: Received IV Ferhaeme 510 mg x 2 doses 4)  05/01/2022: Hgb 12.3, white blood cell count 5.2, MCV 77.4, and platelets of 313.  Interval History:  Tina Spence 22 y.o. female with medical history significant for iron deficiency anemia secondary to GYN bleeding who presents for a follow up visit. The patient's last visit was on 05/01/2022. In the interim since the last visit she has had no major changes in her health.  On exam today Mrs. Robart reports her energy levels had been good up until mid May.  She reports that her energy today unfortunately is about 4 out of 10.  She notes that she is back on birth control for her menstrual cycles but unfortunately is still having cycles about every 2 weeks.  She has she is doing her best to try to eat iron rich food, including steaks and hamburgers 3 times per week.  She is taking her iron pills every other day with orange juice.  She is not having any stomach upset as result of the iron pills.  Other than low energy and menstrual cycles she is not having any overt signs of bleeding elsewhere such as nosebleeds, gum bleeding, blood in the urine, or blood in the stool..  She denies any fevers, chills, sweats, nausea, vomiting or diarrhea, but does endorse having feelings of dizziness.  A 10 point ROS was otherwise negative.  MEDICAL HISTORY:  Past Medical  History:  Diagnosis Date   Anxiety    Gastritis 08/2022   Sickle cell trait (HCC)     SURGICAL HISTORY: Past Surgical History:  Procedure Laterality Date   CYST REMOVAL NECK      SOCIAL HISTORY: Social History   Socioeconomic History   Marital status: Single    Spouse name: Not on file   Number of children: Not on file   Years of education: Not on file   Highest education level: Not on file  Occupational History   Not on file  Tobacco Use   Smoking status: Never   Smokeless tobacco: Never  Vaping Use   Vaping Use: Never used  Substance and Sexual Activity   Alcohol use: Not Currently    Comment: occ   Drug use: Not Currently   Sexual activity: Yes    Birth control/protection: Condom  Other Topics Concern   Not on file  Social History Narrative   Not on file   Social Determinants of Health   Financial Resource Strain: Not on file  Food Insecurity: Not on file  Transportation Needs: Not on file  Physical Activity: Not on file  Stress: Not on file  Social Connections: Not on file  Intimate Partner Violence: Not on file    FAMILY HISTORY: Family History  Problem Relation Age of Onset   Anemia Mother    Sickle cell trait Mother    Stroke Father    Hypertension Father    Heart disease Father  Sickle cell trait Sister    Breast cancer Maternal Great-grandmother    Sickle cell anemia Other    Colon cancer Neg Hx    Esophageal cancer Neg Hx    Colon polyps Neg Hx    Pancreatic cancer Neg Hx    Stomach cancer Neg Hx     ALLERGIES:  has No Known Allergies.  MEDICATIONS:  Current Outpatient Medications  Medication Sig Dispense Refill   Drospirenone (SLYND) 4 MG TABS Take 1 tablet (4 mg total) by mouth daily before breakfast. 28 tablet 11   FEROSUL 325 (65 Fe) MG tablet Take 1 tablet (325 mg total) by mouth every other day. 30 tablet 6   PARoxetine (PAXIL) 10 MG tablet Take 1 tablet (10 mg total) by mouth daily. 30 tablet 11   Prenat-Fe  Poly-Methfol-FA-DHA (VITAFOL ULTRA) 29-0.6-0.4-200 MG CAPS Take 1 capsule by mouth daily before breakfast. 90 capsule 4   No current facility-administered medications for this visit.    REVIEW OF SYSTEMS:   Constitutional: ( - ) fevers, ( - )  chills , ( - ) night sweats Eyes: ( - ) blurriness of vision, ( - ) double vision, ( - ) watery eyes Ears, nose, mouth, throat, and face: ( - ) mucositis, ( - ) sore throat Respiratory: ( - ) cough, ( - ) dyspnea, ( - ) wheezes Cardiovascular: ( - ) palpitation, ( - ) chest discomfort, ( - ) lower extremity swelling Gastrointestinal:  ( - ) nausea, ( - ) heartburn, ( - ) change in bowel habits Skin: ( - ) abnormal skin rashes Lymphatics: ( - ) new lymphadenopathy, ( - ) easy bruising Neurological: ( - ) numbness, ( - ) tingling, ( - ) new weaknesses Behavioral/Psych: ( - ) mood change, ( - ) new changes  All other systems were reviewed with the patient and are negative.  PHYSICAL EXAMINATION:  Vitals:   11/13/22 1102  BP: (!) 128/94  Pulse: 69  Resp: 18  Temp: 97.9 F (36.6 C)  SpO2: 100%    Filed Weights   11/13/22 1102  Weight: 217 lb 6.4 oz (98.6 kg)     GENERAL: Well-appearing young African-American female, alert, no distress and comfortable SKIN: skin color, texture, turgor are normal, no rashes or significant lesions EYES: conjunctiva are pink and non-injected, sclera clear LUNGS: clear to auscultation and percussion with normal breathing effort HEART: regular rate & rhythm and no murmurs and no lower extremity edema Musculoskeletal: no cyanosis of digits and no clubbing  PSYCH: alert & oriented x 3, fluent speech NEURO: no focal motor/sensory deficits  LABORATORY DATA:  I have reviewed the data as listed    Latest Ref Rng & Units 11/06/2022   11:03 AM 08/06/2022   12:24 PM 05/01/2022    8:30 AM  CBC  WBC 4.0 - 10.5 K/uL 4.2  5.3  5.2   Hemoglobin 12.0 - 15.0 g/dL 22.0  25.4  27.0   Hematocrit 36.0 - 46.0 % 38.5  38.5   36.4   Platelets 150 - 400 K/uL 324  319.0  313        Latest Ref Rng & Units 11/06/2022   11:03 AM 08/06/2022   12:24 PM 05/01/2022    8:30 AM  CMP  Glucose 70 - 99 mg/dL 623  82  762   BUN 6 - 20 mg/dL 8  8  10    Creatinine 0.44 - 1.00 mg/dL 8.31  5.17  6.16   Sodium  135 - 145 mmol/L 138  139  138   Potassium 3.5 - 5.1 mmol/L 3.8  3.8  3.9   Chloride 98 - 111 mmol/L 106  107  106   CO2 22 - 32 mmol/L 27  25  27    Calcium 8.9 - 10.3 mg/dL 9.3  9.5  9.4   Total Protein 6.5 - 8.1 g/dL 7.3  7.0  7.1   Total Bilirubin 0.3 - 1.2 mg/dL 0.5  0.4  0.3   Alkaline Phos 38 - 126 U/L 68  67  68   AST 15 - 41 U/L 14  14  13    ALT 0 - 44 U/L 11  11  9       RADIOGRAPHIC STUDIES: No results found.  ASSESSMENT & PLAN Tina Spence 22 y.o. female with medical history significant for iron deficiency anemia secondary to GYN bleeding who presents for a follow up visit.   #Iron deficiency anemia 2/2 menstrual bleeding -- Received IV feraheme 510 mg x 2 doses on 05/08/2021-05/15/2021 --Labs today show Hgb 12.4, MCV 77.2, WBC 4.2, Plt 324, Iron 77, ferritin 8, Sat 21% --Recommend IV iron due to persistent iron deficiency and symptoms despite adequate p.o. supplementation. -- continue ferrous sulfate 325 mg QOD. Recommend to take with a source of vitamin C --Continue to incorporate iron rich foods into diet.   --Under the care of OB/GYN, currently on any birth control ---RTC in 3 months with repeat labs   #Sickle Cell Trait: --Hgb electrophoresis from 09/18/2020 confirmed sickle cell trait (heterozygous).  No orders of the defined types were placed in this encounter.   All questions were answered. The patient knows to call the clinic with any problems, questions or concerns.  A total of more than 30 minutes were spent on this encounter with face-to-face time and non-face-to-face time, including preparing to see the patient, ordering tests and/or medications, counseling the patient and coordination  of care as outlined above.   Ulysees Barns, MD Department of Hematology/Oncology [facility] at [facility] Phone: [phone] Pager: [phone] Email: [email or web address]  11/24/2022 9:37 AM

## 2022-11-24 ENCOUNTER — Encounter: Payer: Self-pay | Admitting: Hematology and Oncology

## 2022-11-25 ENCOUNTER — Other Ambulatory Visit (HOSPITAL_COMMUNITY): Payer: Self-pay | Admitting: Pharmacy Technician

## 2022-11-25 DIAGNOSIS — D509 Iron deficiency anemia, unspecified: Secondary | ICD-10-CM | POA: Insufficient documentation

## 2022-11-26 ENCOUNTER — Telehealth: Payer: Self-pay | Admitting: Pharmacy Technician

## 2022-11-26 NOTE — Telephone Encounter (Signed)
Dr. Leonides Schanz, [facility] note:  Patient will be scheduled as soon as possible  Auth Submission: NO AUTH NEEDED Site of care: Site of care: [facility] Payer: [employer] Medication & CPT/J Code(s) submitted: Venofer (Iron Sucrose) J1756 Route of submission (phone, fax, portal):  Phone # Fax # Auth type: Buy/Bill Units/visits requested: x5 Reference number:  Approval from: 11/26/22 to 03/28/23

## 2022-12-10 ENCOUNTER — Ambulatory Visit (INDEPENDENT_AMBULATORY_CARE_PROVIDER_SITE_OTHER): Payer: 59

## 2022-12-10 VITALS — BP 123/83 | HR 74 | Temp 99.1°F | Resp 16 | Ht 65.0 in | Wt 223.5 lb

## 2022-12-10 DIAGNOSIS — N920 Excessive and frequent menstruation with regular cycle: Secondary | ICD-10-CM | POA: Diagnosis not present

## 2022-12-10 DIAGNOSIS — D500 Iron deficiency anemia secondary to blood loss (chronic): Secondary | ICD-10-CM

## 2022-12-10 DIAGNOSIS — E611 Iron deficiency: Secondary | ICD-10-CM

## 2022-12-10 MED ORDER — DIPHENHYDRAMINE HCL 25 MG PO CAPS
25.0000 mg | ORAL_CAPSULE | Freq: Once | ORAL | Status: AC
  Administered 2022-12-10: 25 mg via ORAL
  Filled 2022-12-10: qty 1

## 2022-12-10 MED ORDER — ACETAMINOPHEN 325 MG PO TABS
650.0000 mg | ORAL_TABLET | Freq: Once | ORAL | Status: AC
  Administered 2022-12-10: 650 mg via ORAL
  Filled 2022-12-10: qty 2

## 2022-12-10 MED ORDER — SODIUM CHLORIDE 0.9 % IV SOLN
200.0000 mg | Freq: Once | INTRAVENOUS | Status: AC
  Administered 2022-12-10: 200 mg via INTRAVENOUS
  Filled 2022-12-10: qty 10

## 2022-12-10 NOTE — Progress Notes (Signed)
Diagnosis: Acute Anemia  Provider:  Chilton Greathouse MD  Procedure: IV Infusion  IV Type: Peripheral, IV Location: R Antecubital  Venofer (Iron Sucrose), Dose: 200 mg  Infusion Start Time: 0955  Infusion Stop Time: 1016  Post Infusion IV Care: Observation period completed and Peripheral IV Discontinued  Discharge: Condition: Good, Destination: Home . AVS Provided  Performed by:  Nat Math, RN

## 2022-12-17 ENCOUNTER — Ambulatory Visit (INDEPENDENT_AMBULATORY_CARE_PROVIDER_SITE_OTHER): Payer: 59 | Admitting: *Deleted

## 2022-12-17 VITALS — BP 122/81 | HR 82 | Temp 98.5°F | Resp 18 | Ht 65.0 in | Wt 220.6 lb

## 2022-12-17 DIAGNOSIS — D500 Iron deficiency anemia secondary to blood loss (chronic): Secondary | ICD-10-CM | POA: Diagnosis not present

## 2022-12-17 DIAGNOSIS — N920 Excessive and frequent menstruation with regular cycle: Secondary | ICD-10-CM

## 2022-12-17 DIAGNOSIS — E611 Iron deficiency: Secondary | ICD-10-CM

## 2022-12-17 MED ORDER — SODIUM CHLORIDE 0.9 % IV SOLN
200.0000 mg | Freq: Once | INTRAVENOUS | Status: AC
  Administered 2022-12-17: 200 mg via INTRAVENOUS
  Filled 2022-12-17: qty 10

## 2022-12-17 MED ORDER — ACETAMINOPHEN 325 MG PO TABS
650.0000 mg | ORAL_TABLET | Freq: Once | ORAL | Status: AC
  Administered 2022-12-17: 650 mg via ORAL
  Filled 2022-12-17: qty 2

## 2022-12-17 MED ORDER — DIPHENHYDRAMINE HCL 25 MG PO CAPS
25.0000 mg | ORAL_CAPSULE | Freq: Once | ORAL | Status: AC
  Administered 2022-12-17: 25 mg via ORAL
  Filled 2022-12-17: qty 1

## 2022-12-17 NOTE — Progress Notes (Signed)
Diagnosis: Iron Deficiency Anemia  Provider:  Chilton Greathouse MD  Procedure: IV Infusion  IV Type: Peripheral, IV Location: R Antecubital  Venofer (Iron Sucrose), Dose: 200 mg  Infusion Start Time: 0927 am  Infusion Stop Time: 0941 am  Post Infusion IV Care: Observation period completed and Peripheral IV Discontinued  Discharge: Condition: Good, Destination: Home . AVS Provided  Performed by:  Forrest Moron, RN

## 2022-12-22 ENCOUNTER — Encounter (INDEPENDENT_AMBULATORY_CARE_PROVIDER_SITE_OTHER): Payer: Self-pay

## 2022-12-22 ENCOUNTER — Ambulatory Visit (INDEPENDENT_AMBULATORY_CARE_PROVIDER_SITE_OTHER): Payer: 59 | Admitting: Physician Assistant

## 2022-12-22 VITALS — BP 124/88 | HR 80 | Resp 16 | Ht 65.0 in | Wt 220.6 lb

## 2022-12-22 DIAGNOSIS — R739 Hyperglycemia, unspecified: Secondary | ICD-10-CM

## 2022-12-22 DIAGNOSIS — D500 Iron deficiency anemia secondary to blood loss (chronic): Secondary | ICD-10-CM

## 2022-12-22 MED ORDER — FEROSUL 325 (65 FE) MG PO TABS
325.0000 mg | ORAL_TABLET | ORAL | 6 refills | Status: AC
Start: 2022-12-22 — End: ?

## 2022-12-22 NOTE — Patient Instructions (Signed)
Drink 80 ounces water daily °

## 2022-12-22 NOTE — Progress Notes (Signed)
Patient ID: Tina Spence, female   DOB: 2000/06/27, 22 y.o.   MRN: [id number]   Tina Spence, is a 21 y.o. female  OZH:[id number]  GEX:[id number]  DOB - 01/23/2001  No chief complaint on file.      Subjective:   Tina Spence is a 22 y.o. female here today for screening for diabetes since most recent blood sugar was 108.  Diabetes does run in her family.  She started iron infusions about 2 weeks ago and is also on iron tablets(needs RF of tablets.).  she has also been on OCP for about 3 months.  Her periods are starting to get lighter.  She craves pasta but no PICA.    No problems updated.  ALLERGIES: No Known Allergies  PAST MEDICAL HISTORY: Past Medical History:  Diagnosis Date   Anxiety    Gastritis 08/2022   Sickle cell trait (HCC)     MEDICATIONS AT HOME: Prior to Admission medications   Medication Sig Start Date End Date Taking? Authorizing Provider  Drospirenone (SLYND) 4 MG TABS Take 1 tablet (4 mg total) by mouth daily before breakfast. 10/06/22   Brock Bad, MD  FEROSUL 325 (65 Fe) MG tablet Take 1 tablet (325 mg total) by mouth every other day. 12/22/22   Anders Simmonds, PA-C  PARoxetine (PAXIL) 10 MG tablet Take 1 tablet (10 mg total) by mouth daily. 10/06/22   Brock Bad, MD  Prenat-Fe Poly-Methfol-FA-DHA (VITAFOL ULTRA) 29-0.6-0.4-200 MG CAPS Take 1 capsule by mouth daily before breakfast. 10/22/21   Brock Bad, MD    ROS: Neg HEENT Neg resp Neg cardiac Neg GI Neg GU Neg MS Neg psych Neg neuro  Objective:   Vitals:   12/22/22 1512  BP: 124/88  Pulse: 80  Resp: 16  SpO2: 99%  Weight: 220 lb 9.6 oz (100.1 kg)  Height: 5\' 5"  (1.651 m)   Exam General appearance : Awake, alert, not in any distress. Speech Clear. Not toxic looking HEENT: Atraumatic and Normocephalic Chest: Good air entry bilaterally, CTAB.  No rales/rhonchi/wheezing CVS: S1 S2 regular, no murmurs.  Extremities: B/L Lower Ext shows no edema, both legs are warm to  touch Neurology: Awake alert, and oriented X 3, CN II-XII intact, Non focal Skin: No Rash  Data Review Lab Results  Component Value Date   HGBA1C 5.4 01/15/2022    Assessment & Plan   1. Iron deficiency anemia due to chronic blood loss Drink adequate water daily - FEROSUL 325 (65 Fe) MG tablet; Take 1 tablet (325 mg total) by mouth every other day.  Dispense: 30 tablet; Refill: 6  2. Elevated blood sugar Work at a goal of eliminating sugary drinks, candy, desserts, sweets, refined sugars, processed foods, and white carbohydrates.  - Hemoglobin A1c    Return if symptoms worsen or fail to improve, for will need to be assigned new PCP since Dr Delford Field retiring.  The patient was given clear instructions to go to ER or return to medical center if symptoms don't improve, worsen or new problems develop. The patient verbalized understanding. The patient was told to call to get lab results if they haven't heard anything in the next week.      Georgian Co, PA-C [facility] [place], [place] [phone]   12/22/2022, 3:46 PM

## 2022-12-23 LAB — HEMOGLOBIN A1C
Est. average glucose Bld gHb Est-mCnc: 114 mg/dL
Hgb A1c MFr Bld: 5.6 % (ref 4.8–5.6)

## 2022-12-24 ENCOUNTER — Ambulatory Visit (INDEPENDENT_AMBULATORY_CARE_PROVIDER_SITE_OTHER): Payer: 59

## 2022-12-24 VITALS — BP 105/71 | HR 64 | Temp 98.4°F | Resp 16 | Ht 65.0 in | Wt 219.4 lb

## 2022-12-24 DIAGNOSIS — D500 Iron deficiency anemia secondary to blood loss (chronic): Secondary | ICD-10-CM | POA: Diagnosis not present

## 2022-12-24 DIAGNOSIS — N920 Excessive and frequent menstruation with regular cycle: Secondary | ICD-10-CM

## 2022-12-24 DIAGNOSIS — E611 Iron deficiency: Secondary | ICD-10-CM

## 2022-12-24 MED ORDER — ACETAMINOPHEN 325 MG PO TABS
650.0000 mg | ORAL_TABLET | Freq: Once | ORAL | Status: AC
  Administered 2022-12-24: 650 mg via ORAL
  Filled 2022-12-24: qty 2

## 2022-12-24 MED ORDER — SODIUM CHLORIDE 0.9 % IV SOLN
200.0000 mg | Freq: Once | INTRAVENOUS | Status: AC
  Administered 2022-12-24: 200 mg via INTRAVENOUS
  Filled 2022-12-24: qty 10

## 2022-12-24 MED ORDER — DIPHENHYDRAMINE HCL 25 MG PO CAPS
25.0000 mg | ORAL_CAPSULE | Freq: Once | ORAL | Status: AC
  Administered 2022-12-24: 25 mg via ORAL
  Filled 2022-12-24: qty 1

## 2022-12-24 NOTE — Progress Notes (Signed)
Diagnosis: Iron Deficiency Anemia  Provider:  Chilton Greathouse MD  Procedure: IV Infusion  IV Type: Peripheral, IV Location: R Antecubital  Venofer (Iron Sucrose), Dose: 200 mg  Infusion Start Time: 8295  Infusion Stop Time: 0957  Post Infusion IV Care: Observation period completed. PIV removed.  Discharge: Condition: Good, Destination: Home . AVS Declined  Performed by:  Rico Ala, LPN

## 2022-12-31 ENCOUNTER — Ambulatory Visit: Payer: 59 | Admitting: *Deleted

## 2022-12-31 MED ORDER — DIPHENHYDRAMINE HCL 25 MG PO CAPS: 25.0000 mg | ORAL_CAPSULE | Freq: Once | ORAL | Status: DC

## 2022-12-31 MED ORDER — SODIUM CHLORIDE 0.9 % IV SOLN
200.0000 mg | Freq: Once | INTRAVENOUS | Status: DC
  Filled 2022-12-31: qty 10

## 2022-12-31 MED ORDER — ACETAMINOPHEN 325 MG PO TABS: 650.0000 mg | ORAL_TABLET | Freq: Once | ORAL | Status: DC

## 2023-01-01 ENCOUNTER — Ambulatory Visit (INDEPENDENT_AMBULATORY_CARE_PROVIDER_SITE_OTHER): Payer: 59

## 2023-01-01 VITALS — BP 131/90 | HR 66 | Temp 98.0°F | Resp 16 | Ht 65.0 in | Wt 222.0 lb

## 2023-01-01 DIAGNOSIS — D500 Iron deficiency anemia secondary to blood loss (chronic): Secondary | ICD-10-CM | POA: Diagnosis not present

## 2023-01-01 DIAGNOSIS — N920 Excessive and frequent menstruation with regular cycle: Secondary | ICD-10-CM

## 2023-01-01 DIAGNOSIS — E611 Iron deficiency: Secondary | ICD-10-CM

## 2023-01-01 MED ORDER — ACETAMINOPHEN 325 MG PO TABS: 650.0000 mg | ORAL_TABLET | Freq: Once | ORAL | Status: DC

## 2023-01-01 MED ORDER — DIPHENHYDRAMINE HCL 25 MG PO CAPS: 25.0000 mg | ORAL_CAPSULE | Freq: Once | ORAL | Status: DC

## 2023-01-01 MED ORDER — SODIUM CHLORIDE 0.9 % IV SOLN
200.0000 mg | Freq: Once | INTRAVENOUS | Status: AC
  Administered 2023-01-01: 200 mg via INTRAVENOUS
  Filled 2023-01-01: qty 10

## 2023-01-01 NOTE — Progress Notes (Signed)
Diagnosis: Iron Deficiency Anemia  Provider:  Chilton Greathouse MD  Procedure: IV Infusion  IV Type: Peripheral, IV Location: R Antecubital  Venofer (Iron Sucrose), Dose: 200 mg  Infusion Start Time: 0921  Infusion Stop Time: 0940  Post Infusion IV Care: Patient declined observation and Peripheral IV Discontinued  Discharge: Condition: Good, Destination: Home . AVS Declined  Performed by:  Loney Hering, LPN

## 2023-01-02 ENCOUNTER — Ambulatory Visit
Admission: EM | Admit: 2023-01-02 | Discharge: 2023-01-02 | Disposition: A | Discharge status: Home / Self Care | Payer: 59 | Attending: Emergency Medicine | Admitting: Emergency Medicine

## 2023-01-02 DIAGNOSIS — H1012 Acute atopic conjunctivitis, left eye: Secondary | ICD-10-CM | POA: Diagnosis not present

## 2023-01-02 MED ORDER — OFLOXACIN 0.3 % OP SOLN: 1.0000 [drp] | Freq: Four times a day (QID) | OPHTHALMIC | 0 refills | Status: DC

## 2023-01-02 NOTE — ED Triage Notes (Signed)
Pt reports swelling and pain when she closes the eye in left eye x 5 days. Cold/hot compress, eye solution wash, tea bag gives no  relief. Eye wash makes left eye itching.

## 2023-01-02 NOTE — ED Provider Notes (Signed)
[facility]   [id number] 01/02/23 Arrival Time: 0818  Chief Complaint  Patient presents with   Eye Problem      SUBJECTIVE:  Tina Spence is a 22 y.o. female w presents to the urgent care with a complaint of left eye swelling and itchiness for the past 5 days.  Denies a precipitating event, trauma, or close contacts with similar symptoms.  Has tried OTC eye wash without relief.  Any aggravating factors.  Denies similar symptoms in the past.  Denies fever, chills, nausea, vomiting, eye pain, painful eye movements, halos, vision changes, double vision, FB sensation, periorbital erythema.     Denies contact lens use.    ROS: As per HPI.  All other pertinent ROS negative.     Past Medical History:  Diagnosis Date   Anxiety    Gastritis 08/2022   Sickle cell trait (HCC)    Past Surgical History:  Procedure Laterality Date   CYST REMOVAL NECK     No Known Allergies Current Facility-Administered Medications on File Prior to Encounter  Medication Dose Route Frequency Provider Last Rate Last Admin   acetaminophen (TYLENOL) tablet 650 mg  650 mg Oral Once Desma Mcgregor, RPH       diphenhydrAMINE (BENADRYL) capsule 25 mg  25 mg Oral Once Desma Mcgregor, [facility]       iron sucrose (VENOFER) 200 mg in sodium chloride 0.9 % 100 mL IVPB  200 mg Intravenous Once Desma Mcgregor, [facility]       Current Outpatient Medications on File Prior to Encounter  Medication Sig Dispense Refill   Drospirenone (SLYND) 4 MG TABS Take 1 tablet (4 mg total) by mouth daily before breakfast. 28 tablet 11   FEROSUL 325 (65 Fe) MG tablet Take 1 tablet (325 mg total) by mouth every other day. 30 tablet 6   PARoxetine (PAXIL) 10 MG tablet Take 1 tablet (10 mg total) by mouth daily. 30 tablet 11   Prenat-Fe Poly-Methfol-FA-DHA (VITAFOL ULTRA) 29-0.6-0.4-200 MG CAPS Take 1 capsule by mouth daily before breakfast. 90 capsule 4   Social History   Socioeconomic History   Marital status: Single    Spouse name: Not on  file   Number of children: Not on file   Years of education: Not on file   Highest education level: Not on file  Occupational History   Not on file  Tobacco Use   Smoking status: Never   Smokeless tobacco: Never  Vaping Use   Vaping status: Never Used  Substance and Sexual Activity   Alcohol use: Yes    Comment: occ   Drug use: Not Currently   Sexual activity: Yes    Birth control/protection: Pill  Other Topics Concern   Not on file  Social History Narrative   Not on file   Social Determinants of Health   Financial Resource Strain: Not on file  Food Insecurity: Not on file  Transportation Needs: Not on file  Physical Activity: Not on file  Stress: Not on file  Social Connections: Not on file  Intimate Partner Violence: Not on file   Family History  Problem Relation Age of Onset   Anemia Mother    Sickle cell trait Mother    Stroke Father    Hypertension Father    Heart disease Father    Sickle cell trait Sister    Breast cancer Maternal Great-grandmother    Sickle cell anemia Other    Colon cancer Neg Hx    Esophageal cancer Neg  Hx    Colon polyps Neg Hx    Pancreatic cancer Neg Hx    Stomach cancer Neg Hx     OBJECTIVE:    Visual Acuity  Right Eye Distance:   Left Eye Distance:   Bilateral Distance:    Right Eye Near:   Left Eye Near:    Bilateral Near:      Vitals:   01/02/23 0858 01/02/23 0901  BP:  (!) 164/105  Pulse:  66  Resp:  16  Temp:  97.8 F (36.6 C)  TempSrc: Oral Oral  SpO2:  98%    Physical Exam Vitals and nursing note reviewed.  Constitutional:      General: She is not in acute distress.    Appearance: Normal appearance. She is normal weight. She is not ill-appearing, toxic-appearing or diaphoretic.  HENT:     Head: Normocephalic.  Eyes:     General: Vision grossly intact. Gaze aligned appropriately.        Right eye: No foreign body or discharge.        Left eye: No foreign body or discharge.     Extraocular Movements:  Extraocular movements intact.  Cardiovascular:     Rate and Rhythm: Normal rate and regular rhythm.     Pulses: Normal pulses.     Heart sounds: Normal heart sounds. No murmur heard.    No friction rub. No gallop.  Pulmonary:     Effort: Pulmonary effort is normal. No respiratory distress.     Breath sounds: Normal breath sounds. No stridor. No wheezing, rhonchi or rales.  Chest:     Chest wall: No tenderness.  Neurological:     Mental Status: She is alert and oriented to person, place, and time.       ASSESSMENT & PLAN:  1. Allergic conjunctivitis of left eye     Meds ordered this encounter  Medications   ofloxacin (OCUFLOX) 0.3 % ophthalmic solution    Sig: Place 1 drop into the left eye 4 (four) times daily.    Dispense:  5 mL    Refill:  0     Discharge instructions  Use eye drops as prescribed and to completion Ofloxacin eyedrop was prescribed Continue to take Zyrtec at nighttime as prescribed Wash pillow cases, wash hands regularly with soap and water, avoid touching your face and eyes, wash door handles, light switches, remotes and other objects you frequently touch Return or follow up with PCP if symptoms persists such as fever, chills, redness, swelling, eye pain, painful eye movements, vision changes, etc...  Reviewed expectations re: course of current medical issues. Questions answered. Outlined signs and symptoms indicating need for more acute intervention. Patient verbalized understanding. After Visit Summary given.    Durward Parcel, FNP 01/02/23 [phone]

## 2023-01-02 NOTE — Discharge Instructions (Signed)
Use eye drops as prescribed and to completion Ofloxacin eyedrop was prescribed Wash pillow cases, wash hands regularly with soap and water, avoid touching your face and eyes, wash door handles, light switches, remotes and other objects you frequently touch Return or follow up with PCP if symptoms persists such as fever, chills, redness, swelling, eye pain, painful eye movements, vision changes, etc.

## 2023-01-07 ENCOUNTER — Ambulatory Visit (INDEPENDENT_AMBULATORY_CARE_PROVIDER_SITE_OTHER): Payer: 59 | Admitting: *Deleted

## 2023-01-07 VITALS — BP 125/86 | HR 72 | Temp 98.6°F | Resp 14 | Ht 65.0 in | Wt 232.2 lb

## 2023-01-07 DIAGNOSIS — E611 Iron deficiency: Secondary | ICD-10-CM

## 2023-01-07 DIAGNOSIS — N920 Excessive and frequent menstruation with regular cycle: Secondary | ICD-10-CM

## 2023-01-07 DIAGNOSIS — D500 Iron deficiency anemia secondary to blood loss (chronic): Secondary | ICD-10-CM | POA: Diagnosis not present

## 2023-01-07 MED ORDER — SODIUM CHLORIDE 0.9 % IV SOLN
200.0000 mg | Freq: Once | INTRAVENOUS | Status: AC
  Administered 2023-01-07: 200 mg via INTRAVENOUS
  Filled 2023-01-07: qty 10

## 2023-01-07 MED ORDER — ACETAMINOPHEN 325 MG PO TABS: 650.0000 mg | ORAL_TABLET | Freq: Once | ORAL | Status: DC

## 2023-01-07 MED ORDER — DIPHENHYDRAMINE HCL 25 MG PO CAPS: 25.0000 mg | ORAL_CAPSULE | Freq: Once | ORAL | Status: DC

## 2023-01-07 NOTE — Progress Notes (Signed)
Diagnosis: Iron Deficiency Anemia  Provider:  Chilton Greathouse MD  Procedure: IV Infusion  IV Type: Peripheral, IV Location: R Antecubital  Venofer (Iron Sucrose), Dose: 200 mg  Infusion Start Time: 0902 am  Infusion Stop Time: 0920 am  Post Infusion IV Care: Observation period completed and Peripheral IV Discontinued  Discharge: Condition: Good, Destination: Home . AVS Declined  Performed by:  Forrest Moron, RN

## 2023-02-04 ENCOUNTER — Other Ambulatory Visit: Payer: Self-pay | Admitting: Hematology and Oncology

## 2023-02-04 DIAGNOSIS — D500 Iron deficiency anemia secondary to blood loss (chronic): Secondary | ICD-10-CM

## 2023-02-05 ENCOUNTER — Inpatient Hospital Stay: Payer: 59 | Attending: Hematology and Oncology

## 2023-02-12 ENCOUNTER — Inpatient Hospital Stay: Payer: 59 | Admitting: Hematology and Oncology

## 2023-02-12 ENCOUNTER — Other Ambulatory Visit: Payer: 59

## 2023-02-12 NOTE — Progress Notes (Deleted)
[facility] [phone]   [phone]  PROGRESS NOTE  Patient Care Team: Storm Frisk, MD as PCP - General (Pulmonary Disease)  Hematological/Oncological History # Iron Deficiency Anemia 2/2 to GYN Bleeding. 1) Labs from PCP, [name] from [facility] -08/15/2020: WBC 5.8, Hgb 11.6, MCV 73 (L), Plt 404 -09/18/2020: Iron 14 (L), TIBC 408 (H), Ferritin 3.50 (L). Hgb electrophoresis confirmed sickle cell trait (heterozygous).  -11/05/2020: WBC 5.5, Hgb 12.0, MCV 75 (L), Plt 353. 2) 11/29/2020: Establish care with [name] PA-C 3) 05/08/2021-05/15/2021: Received IV Ferhaeme 510 mg x 2 doses 4)  05/01/2022: Hgb 12.3, white blood cell count 5.2, MCV 77.4, and platelets of 313.  Interval History:  Tina Spence 22 y.o. female with medical history significant for iron deficiency anemia secondary to GYN bleeding who presents for a follow up visit. The patient's last visit was on 05/01/2022. In the interim since the last visit she has had no major changes in her health.  On exam today Mrs. Huckabay reports her energy levels had been good up until mid May.  She reports that her energy today unfortunately is about 4 out of 10.  She notes that she is back on birth control for her menstrual cycles but unfortunately is still having cycles about every 2 weeks.  She has she is doing her best to try to eat iron rich food, including steaks and hamburgers 3 times per week.  She is taking her iron pills every other day with orange juice.  She is not having any stomach upset as result of the iron pills.  Other than low energy and menstrual cycles she is not having any overt signs of bleeding elsewhere such as nosebleeds, gum bleeding, blood in the urine, or blood in the stool..  She denies any fevers, chills, sweats, nausea, vomiting or diarrhea, but does endorse having feelings of dizziness.  A 10 point ROS was otherwise negative.  MEDICAL HISTORY:  Past Medical  History:  Diagnosis Date   Anxiety    Gastritis 08/2022   Sickle cell trait (HCC)     SURGICAL HISTORY: Past Surgical History:  Procedure Laterality Date   CYST REMOVAL NECK      SOCIAL HISTORY: Social History   Socioeconomic History   Marital status: Single    Spouse name: Not on file   Number of children: Not on file   Years of education: Not on file   Highest education level: Not on file  Occupational History   Not on file  Tobacco Use   Smoking status: Never   Smokeless tobacco: Never  Vaping Use   Vaping status: Never Used  Substance and Sexual Activity   Alcohol use: Yes    Comment: occ   Drug use: Not Currently   Sexual activity: Yes    Birth control/protection: Pill  Other Topics Concern   Not on file  Social History Narrative   Not on file   Social Determinants of Health   Financial Resource Strain: Not on file  Food Insecurity: Not on file  Transportation Needs: Not on file  Physical Activity: Not on file  Stress: Not on file  Social Connections: Not on file  Intimate Partner Violence: Not on file    FAMILY HISTORY: Family History  Problem Relation Age of Onset   Anemia Mother    Sickle cell trait Mother    Stroke Father    Hypertension Father    Heart disease Father  Sickle cell trait Sister    Breast cancer Maternal Great-grandmother    Sickle cell anemia Other    Colon cancer Neg Hx    Esophageal cancer Neg Hx    Colon polyps Neg Hx    Pancreatic cancer Neg Hx    Stomach cancer Neg Hx     ALLERGIES:  has No Known Allergies.  MEDICATIONS:  Current Outpatient Medications  Medication Sig Dispense Refill   Drospirenone (SLYND) 4 MG TABS Take 1 tablet (4 mg total) by mouth daily before breakfast. 28 tablet 11   FEROSUL 325 (65 Fe) MG tablet Take 1 tablet (325 mg total) by mouth every other day. 30 tablet 6   ofloxacin (OCUFLOX) 0.3 % ophthalmic solution Place 1 drop into the left eye 4 (four) times daily. 5 mL 0   PARoxetine  (PAXIL) 10 MG tablet Take 1 tablet (10 mg total) by mouth daily. 30 tablet 11   Prenat-Fe Poly-Methfol-FA-DHA (VITAFOL ULTRA) 29-0.6-0.4-200 MG CAPS Take 1 capsule by mouth daily before breakfast. 90 capsule 4   No current facility-administered medications for this visit.   Facility-Administered Medications Ordered in Other Visits  Medication Dose Route Frequency Provider Last Rate Last Admin   acetaminophen (TYLENOL) tablet 650 mg  650 mg Oral Once Desma Mcgregor, RPH       diphenhydrAMINE (BENADRYL) capsule 25 mg  25 mg Oral Once Desma Mcgregor, [facility]       iron sucrose (VENOFER) 200 mg in sodium chloride 0.9 % 100 mL IVPB  200 mg Intravenous Once Desma Mcgregor, [facility]        REVIEW OF SYSTEMS:   Constitutional: ( - ) fevers, ( - )  chills , ( - ) night sweats Eyes: ( - ) blurriness of vision, ( - ) double vision, ( - ) watery eyes Ears, nose, mouth, throat, and face: ( - ) mucositis, ( - ) sore throat Respiratory: ( - ) cough, ( - ) dyspnea, ( - ) wheezes Cardiovascular: ( - ) palpitation, ( - ) chest discomfort, ( - ) lower extremity swelling Gastrointestinal:  ( - ) nausea, ( - ) heartburn, ( - ) change in bowel habits Skin: ( - ) abnormal skin rashes Lymphatics: ( - ) new lymphadenopathy, ( - ) easy bruising Neurological: ( - ) numbness, ( - ) tingling, ( - ) new weaknesses Behavioral/Psych: ( - ) mood change, ( - ) new changes  All other systems were reviewed with the patient and are negative.  PHYSICAL EXAMINATION:  There were no vitals filed for this visit.   There were no vitals filed for this visit.    GENERAL: Well-appearing young African-American female, alert, no distress and comfortable SKIN: skin color, texture, turgor are normal, no rashes or significant lesions EYES: conjunctiva are pink and non-injected, sclera clear LUNGS: clear to auscultation and percussion with normal breathing effort HEART: regular rate & rhythm and no murmurs and no lower extremity  edema Musculoskeletal: no cyanosis of digits and no clubbing  PSYCH: alert & oriented x 3, fluent speech NEURO: no focal motor/sensory deficits  LABORATORY DATA:  I have reviewed the data as listed    Latest Ref Rng & Units 11/06/2022   11:03 AM 08/06/2022   12:24 PM 05/01/2022    8:30 AM  CBC  WBC 4.0 - 10.5 K/uL 4.2  5.3  5.2   Hemoglobin 12.0 - 15.0 g/dL 16.1  09.6  04.5   Hematocrit 36.0 - 46.0 % 38.5  38.5  36.4   Platelets 150 - 400 K/uL 324  319.0  313        Latest Ref Rng & Units 11/06/2022   11:03 AM 08/06/2022   12:24 PM 05/01/2022    8:30 AM  CMP  Glucose 70 - 99 mg/dL 161  82  096   BUN 6 - 20 mg/dL 8  8  10    Creatinine 0.44 - 1.00 mg/dL 0.45  4.09  8.11   Sodium 135 - 145 mmol/L 138  139  138   Potassium 3.5 - 5.1 mmol/L 3.8  3.8  3.9   Chloride 98 - 111 mmol/L 106  107  106   CO2 22 - 32 mmol/L 27  25  27    Calcium 8.9 - 10.3 mg/dL 9.3  9.5  9.4   Total Protein 6.5 - 8.1 g/dL 7.3  7.0  7.1   Total Bilirubin 0.3 - 1.2 mg/dL 0.5  0.4  0.3   Alkaline Phos 38 - 126 U/L 68  67  68   AST 15 - 41 U/L 14  14  13    ALT 0 - 44 U/L 11  11  9       RADIOGRAPHIC STUDIES: No results found.  ASSESSMENT & PLAN Tina Spence 22 y.o. female with medical history significant for iron deficiency anemia secondary to GYN bleeding who presents for a follow up visit.   #Iron deficiency anemia 2/2 menstrual bleeding -- Received IV feraheme 510 mg x 2 doses on 05/08/2021-05/15/2021 --Labs today show Hgb 12.4, MCV 77.2, WBC 4.2, Plt 324, Iron 77, ferritin 8, Sat 21% --Recommend IV iron due to persistent iron deficiency and symptoms despite adequate p.o. supplementation. -- continue ferrous sulfate 325 mg QOD. Recommend to take with a source of vitamin C --Continue to incorporate iron rich foods into diet.   --Under the care of OB/GYN, currently on any birth control ---RTC in 3 months with repeat labs   #Sickle Cell Trait: --Hgb electrophoresis from 09/18/2020 confirmed sickle cell  trait (heterozygous).  No orders of the defined types were placed in this encounter.   All questions were answered. The patient knows to call the clinic with any problems, questions or concerns.  A total of more than 30 minutes were spent on this encounter with face-to-face time and non-face-to-face time, including preparing to see the patient, ordering tests and/or medications, counseling the patient and coordination of care as outlined above.   Ulysees Barns, MD Department of Hematology/Oncology [facility] Phone: [phone] Pager: [phone] Email: [email or web address]  02/12/2023 7:10 AM

## 2023-03-03 ENCOUNTER — Telehealth: Payer: Self-pay | Admitting: Hematology and Oncology

## 2023-03-03 NOTE — Telephone Encounter (Signed)
Per Staff message on 03/03/23 I called and spoke to patient and rescheduled her appointments. The patient is aware of new date and time.

## 2023-03-09 ENCOUNTER — Ambulatory Visit
Admission: EM | Admit: 2023-03-09 | Discharge: 2023-03-09 | Disposition: A | Discharge status: Home / Self Care | Payer: 59 | Attending: Internal Medicine | Admitting: Internal Medicine

## 2023-03-09 DIAGNOSIS — J019 Acute sinusitis, unspecified: Secondary | ICD-10-CM | POA: Diagnosis not present

## 2023-03-09 MED ORDER — AMOXICILLIN 875 MG PO TABS: 875.0000 mg | ORAL_TABLET | Freq: Two times a day (BID) | ORAL | 0 refills | Status: DC

## 2023-03-09 MED ORDER — CETIRIZINE HCL 10 MG PO TABS: 10.0000 mg | ORAL_TABLET | Freq: Every day | ORAL | 0 refills | Status: DC

## 2023-03-09 MED ORDER — PSEUDOEPHEDRINE HCL 30 MG PO TABS: 30.0000 mg | ORAL_TABLET | Freq: Three times a day (TID) | ORAL | 0 refills | Status: DC | PRN

## 2023-03-09 NOTE — Discharge Instructions (Signed)
We will manage this as a sinus infection with amoxicillin. For sore throat or cough try using a honey-based tea. Use 3 teaspoons of honey with juice squeezed from half lemon. Place shaved pieces of ginger into 1/2-1 cup of water and warm over stove top. Then mix the ingredients and repeat every 4 hours as needed. Please take ibuprofen 600mg  every 6 hours with food alternating with OR taken together with Tylenol 650mg  every 6 hours for throat pain, fevers, aches and pains. Hydrate very well with at least 2 liters of water. Eat light meals such as soups (chicken and noodles, vegetable, chicken and wild rice).  Do not eat foods that you are allergic to.  Taking an antihistamine like Zyrtec can help against postnasal drainage, sinus congestion which can cause sinus pain, sinus headaches, throat pain, painful swallowing, coughing.  You can take this together with pseudoephedrine (Sudafed) at a dose of 30 mg 3 times a day or twice daily as needed for the same kind of nasal drip, congestion.  Use cough medication as needed.

## 2023-03-09 NOTE — ED Provider Notes (Signed)
[facility]  Note:  This document was prepared using Conservation officer, historic buildings and may include unintentional dictation errors.  MRN: [id number] DOB: [birth year]  Subjective:   Tina Spence is a 22 y.o. female presenting for 2-week history of persistent sinus congestion, sinus pressure, sinus headache, throat pain, runny and stuffy nose, coughing.  No chest pain, shortness of breath or wheezing.  No history of asthma.  No history of allergies.  She does have a history of tonsillitis, has had her tonsillectomy.  No current facility-administered medications for this encounter.  Current Outpatient Medications:    Drospirenone (SLYND) 4 MG TABS, Take 1 tablet (4 mg total) by mouth daily before breakfast., Disp: 28 tablet, Rfl: 11   FEROSUL 325 (65 Fe) MG tablet, Take 1 tablet (325 mg total) by mouth every other day., Disp: 30 tablet, Rfl: 6   ofloxacin (OCUFLOX) 0.3 % ophthalmic solution, Place 1 drop into the left eye 4 (four) times daily., Disp: 5 mL, Rfl: 0   PARoxetine (PAXIL) 10 MG tablet, Take 1 tablet (10 mg total) by mouth daily., Disp: 30 tablet, Rfl: 11   Prenat-Fe Poly-Methfol-FA-DHA (VITAFOL ULTRA) 29-0.6-0.4-200 MG CAPS, Take 1 capsule by mouth daily before breakfast., Disp: 90 capsule, Rfl: 4  Facility-Administered Medications Ordered in Other Encounters:    acetaminophen (TYLENOL) tablet 650 mg, 650 mg, Oral, Once, Desma Mcgregor, RPH   diphenhydrAMINE (BENADRYL) capsule 25 mg, 25 mg, Oral, Once, Desma Mcgregor, RPH   iron sucrose (VENOFER) 200 mg in sodium chloride 0.9 % 100 mL IVPB, 200 mg, Intravenous, Once, Desma Mcgregor, RPH   No Known Allergies  Past Medical History:  Diagnosis Date   Anxiety    Gastritis 08/2022   Sickle cell trait (HCC)      Past Surgical History:  Procedure Laterality Date   CYST REMOVAL NECK      Family History  Problem Relation Age of Onset   Anemia Mother    Sickle cell trait Mother    Stroke Father     Hypertension Father    Heart disease Father    Sickle cell trait Sister    Breast cancer Maternal Great-grandmother    Sickle cell anemia Other    Colon cancer Neg Hx    Esophageal cancer Neg Hx    Colon polyps Neg Hx    Pancreatic cancer Neg Hx    Stomach cancer Neg Hx     Social History   Tobacco Use   Smoking status: Never   Smokeless tobacco: Never  Vaping Use   Vaping status: Never Used  Substance Use Topics   Alcohol use: Yes    Comment: occ   Drug use: Never    ROS   Objective:   Vitals: BP (!) 155/85 (BP Location: Right Arm)   Pulse 72   Temp 98.5 F (36.9 C) (Oral)   Resp 18   SpO2 98%   Physical Exam Constitutional:      General: She is not in acute distress.    Appearance: Normal appearance. She is well-developed and normal weight. She is not ill-appearing, toxic-appearing or diaphoretic.  HENT:     Head: Normocephalic and atraumatic.     Right Ear: Tympanic membrane, ear canal and external ear normal. No drainage or tenderness. No middle ear effusion. There is no impacted cerumen. Tympanic membrane is not erythematous or bulging.     Left Ear: Tympanic membrane, ear canal and external ear normal. No drainage or tenderness.  No  middle ear effusion. There is no impacted cerumen. Tympanic membrane is not erythematous or bulging.     Nose: Congestion and rhinorrhea present.     Mouth/Throat:     Mouth: Mucous membranes are moist. No oral lesions.     Pharynx: No pharyngeal swelling, oropharyngeal exudate, posterior oropharyngeal erythema or uvula swelling.     Tonsils: No tonsillar exudate or tonsillar abscesses.  Eyes:     General: No scleral icterus.       Right eye: No discharge.        Left eye: No discharge.     Extraocular Movements: Extraocular movements intact.     Right eye: Normal extraocular motion.     Left eye: Normal extraocular motion.     Conjunctiva/sclera: Conjunctivae normal.  Cardiovascular:     Rate and Rhythm: Normal rate and  regular rhythm.     Heart sounds: Normal heart sounds. No murmur heard.    No friction rub. No gallop.  Pulmonary:     Effort: Pulmonary effort is normal. No respiratory distress.     Breath sounds: No stridor. No wheezing, rhonchi or rales.  Chest:     Chest wall: No tenderness.  Musculoskeletal:     Cervical back: Normal range of motion and neck supple.  Lymphadenopathy:     Cervical: No cervical adenopathy.  Skin:    General: Skin is warm and dry.  Neurological:     General: No focal deficit present.     Mental Status: She is alert and oriented to person, place, and time.  Psychiatric:        Mood and Affect: Mood normal.        Behavior: Behavior normal.     Assessment and Plan :   PDMP not reviewed this encounter.  1. Acute non-recurrent sinusitis, unspecified location    Deferred imaging given clear cardiopulmonary exam, hemodynamically stable vital signs.  Will start empiric treatment for sinusitis with amoxicillin.  Recommended supportive care otherwise. Counseled patient on potential for adverse effects with medications prescribed/recommended today, ER and return-to-clinic precautions discussed, patient verbalized understanding.    Wallis Bamberg, PA-C 03/09/23 2005

## 2023-03-09 NOTE — ED Triage Notes (Signed)
Pt reports nasal congestion, cough, headache, sore throat, runny nose x 2 weeks. Dayquil and Nyquil gives no relief.

## 2023-03-12 ENCOUNTER — Inpatient Hospital Stay: Payer: 59 | Attending: Hematology and Oncology

## 2023-03-22 ENCOUNTER — Inpatient Hospital Stay: Payer: 59 | Admitting: Hematology and Oncology

## 2023-03-22 ENCOUNTER — Other Ambulatory Visit: Payer: 59

## 2023-03-22 NOTE — Progress Notes (Deleted)
[facility] [phone]   [phone]  PROGRESS NOTE  Patient Care Team: Storm Frisk, MD as PCP - General (Pulmonary Disease)  Hematological/Oncological History # Iron Deficiency Anemia 2/2 to GYN Bleeding. 1) Labs from PCP, [name] from [facility] -08/15/2020: WBC 5.8, Hgb 11.6, MCV 73 (L), Plt 404 -09/18/2020: Iron 14 (L), TIBC 408 (H), Ferritin 3.50 (L). Hgb electrophoresis confirmed sickle cell trait (heterozygous).  -11/05/2020: WBC 5.5, Hgb 12.0, MCV 75 (L), Plt 353. 2) 11/29/2020: Establish care with [name] PA-C 3) 05/08/2021-05/15/2021: Received IV Ferhaeme 510 mg x 2 doses 4)  05/01/2022: Hgb 12.3, white blood cell count 5.2, MCV 77.4, and platelets of 313.  Interval History:  Tina Spence 22 y.o. female with medical history significant for iron deficiency anemia secondary to GYN bleeding who presents for a follow up visit. The patient's last visit was on 05/01/2022. In the interim since the last visit she has had no major changes in her health.  On exam today Tina Spence reports her energy levels had been good up until mid May.  She reports that her energy today unfortunately is about 4 out of 10.  She notes that she is back on birth control for her menstrual cycles but unfortunately is still having cycles about every 2 weeks.  She has she is doing her best to try to eat iron rich food, including steaks and hamburgers 3 times per week.  She is taking her iron pills every other day with orange juice.  She is not having any stomach upset as result of the iron pills.  Other than low energy and menstrual cycles she is not having any overt signs of bleeding elsewhere such as nosebleeds, gum bleeding, blood in the urine, or blood in the stool..  She denies any fevers, chills, sweats, nausea, vomiting or diarrhea, but does endorse having feelings of dizziness.  A 10 point ROS was otherwise negative.  MEDICAL HISTORY:  Past Medical  History:  Diagnosis Date   Anxiety    Gastritis 08/2022   Sickle cell trait (HCC)     SURGICAL HISTORY: Past Surgical History:  Procedure Laterality Date   CYST REMOVAL NECK      SOCIAL HISTORY: Social History   Socioeconomic History   Marital status: Single    Spouse name: Not on file   Number of children: Not on file   Years of education: Not on file   Highest education level: Not on file  Occupational History   Not on file  Tobacco Use   Smoking status: Never   Smokeless tobacco: Never  Vaping Use   Vaping status: Never Used  Substance and Sexual Activity   Alcohol use: Yes    Comment: occ   Drug use: Never   Sexual activity: Yes    Birth control/protection: Pill  Other Topics Concern   Not on file  Social History Narrative   Not on file   Social Determinants of Health   Financial Resource Strain: Not on file  Food Insecurity: Not on file  Transportation Needs: Not on file  Physical Activity: Not on file  Stress: Not on file  Social Connections: Not on file  Intimate Partner Violence: Not on file    FAMILY HISTORY: Family History  Problem Relation Age of Onset   Anemia Mother    Sickle cell trait Mother    Stroke Father    Hypertension Father    Heart disease Father    Sickle  cell trait Sister    Breast cancer Maternal Great-grandmother    Sickle cell anemia Other    Colon cancer Neg Hx    Esophageal cancer Neg Hx    Colon polyps Neg Hx    Pancreatic cancer Neg Hx    Stomach cancer Neg Hx     ALLERGIES:  has No Known Allergies.  MEDICATIONS:  Current Outpatient Medications  Medication Sig Dispense Refill   amoxicillin (AMOXIL) 875 MG tablet Take 1 tablet (875 mg total) by mouth 2 (two) times daily. 14 tablet 0   cetirizine (ZYRTEC ALLERGY) 10 MG tablet Take 1 tablet (10 mg total) by mouth daily. 30 tablet 0   Drospirenone (SLYND) 4 MG TABS Take 1 tablet (4 mg total) by mouth daily before breakfast. 28 tablet 11   FEROSUL 325 (65 Fe) MG  tablet Take 1 tablet (325 mg total) by mouth every other day. 30 tablet 6   ofloxacin (OCUFLOX) 0.3 % ophthalmic solution Place 1 drop into the left eye 4 (four) times daily. 5 mL 0   PARoxetine (PAXIL) 10 MG tablet Take 1 tablet (10 mg total) by mouth daily. 30 tablet 11   Prenat-Fe Poly-Methfol-FA-DHA (VITAFOL ULTRA) 29-0.6-0.4-200 MG CAPS Take 1 capsule by mouth daily before breakfast. 90 capsule 4   pseudoephedrine (SUDAFED) 30 MG tablet Take 1 tablet (30 mg total) by mouth every 8 (eight) hours as needed for congestion. 30 tablet 0   No current facility-administered medications for this visit.   Facility-Administered Medications Ordered in Other Visits  Medication Dose Route Frequency Provider Last Rate Last Admin   acetaminophen (TYLENOL) tablet 650 mg  650 mg Oral Once Desma Mcgregor, RPH       diphenhydrAMINE (BENADRYL) capsule 25 mg  25 mg Oral Once Desma Mcgregor, [facility]       iron sucrose (VENOFER) 200 mg in sodium chloride 0.9 % 100 mL IVPB  200 mg Intravenous Once Desma Mcgregor, [facility]        REVIEW OF SYSTEMS:   Constitutional: ( - ) fevers, ( - )  chills , ( - ) night sweats Eyes: ( - ) blurriness of vision, ( - ) double vision, ( - ) watery eyes Ears, nose, mouth, throat, and face: ( - ) mucositis, ( - ) sore throat Respiratory: ( - ) cough, ( - ) dyspnea, ( - ) wheezes Cardiovascular: ( - ) palpitation, ( - ) chest discomfort, ( - ) lower extremity swelling Gastrointestinal:  ( - ) nausea, ( - ) heartburn, ( - ) change in bowel habits Skin: ( - ) abnormal skin rashes Lymphatics: ( - ) new lymphadenopathy, ( - ) easy bruising Neurological: ( - ) numbness, ( - ) tingling, ( - ) new weaknesses Behavioral/Psych: ( - ) mood change, ( - ) new changes  All other systems were reviewed with the patient and are negative.  PHYSICAL EXAMINATION:  There were no vitals filed for this visit.   There were no vitals filed for this visit.    GENERAL: Well-appearing young African-American  female, alert, no distress and comfortable SKIN: skin color, texture, turgor are normal, no rashes or significant lesions EYES: conjunctiva are pink and non-injected, sclera clear LUNGS: clear to auscultation and percussion with normal breathing effort HEART: regular rate & rhythm and no murmurs and no lower extremity edema Musculoskeletal: no cyanosis of digits and no clubbing  PSYCH: alert & oriented x 3, fluent speech NEURO: no focal motor/sensory deficits  LABORATORY DATA:  I have reviewed the data as listed    Latest Ref Rng & Units 11/06/2022   11:03 AM 08/06/2022   12:24 PM 05/01/2022    8:30 AM  CBC  WBC 4.0 - 10.5 K/uL 4.2  5.3  5.2   Hemoglobin 12.0 - 15.0 g/dL 13.2  44.0  10.2   Hematocrit 36.0 - 46.0 % 38.5  38.5  36.4   Platelets 150 - 400 K/uL 324  319.0  313        Latest Ref Rng & Units 11/06/2022   11:03 AM 08/06/2022   12:24 PM 05/01/2022    8:30 AM  CMP  Glucose 70 - 99 mg/dL 725  82  366   BUN 6 - 20 mg/dL 8  8  10    Creatinine 0.44 - 1.00 mg/dL 4.40  3.47  4.25   Sodium 135 - 145 mmol/L 138  139  138   Potassium 3.5 - 5.1 mmol/L 3.8  3.8  3.9   Chloride 98 - 111 mmol/L 106  107  106   CO2 22 - 32 mmol/L 27  25  27    Calcium 8.9 - 10.3 mg/dL 9.3  9.5  9.4   Total Protein 6.5 - 8.1 g/dL 7.3  7.0  7.1   Total Bilirubin 0.3 - 1.2 mg/dL 0.5  0.4  0.3   Alkaline Phos 38 - 126 U/L 68  67  68   AST 15 - 41 U/L 14  14  13    ALT 0 - 44 U/L 11  11  9       RADIOGRAPHIC STUDIES: No results found.  ASSESSMENT & PLAN Tina Spence 22 y.o. female with medical history significant for iron deficiency anemia secondary to GYN bleeding who presents for a follow up visit.   #Iron deficiency anemia 2/2 menstrual bleeding -- Received IV feraheme 510 mg x 2 doses on 05/08/2021-05/15/2021 --Labs today show Hgb 12.4, MCV 77.2, WBC 4.2, Plt 324, Iron 77, ferritin 8, Sat 21% --Recommend IV iron due to persistent iron deficiency and symptoms despite adequate p.o.  supplementation. -- continue ferrous sulfate 325 mg QOD. Recommend to take with a source of vitamin C --Continue to incorporate iron rich foods into diet.   --Under the care of OB/GYN, currently on any birth control ---RTC in 3 months with repeat labs   #Sickle Cell Trait: --Hgb electrophoresis from 09/18/2020 confirmed sickle cell trait (heterozygous).  No orders of the defined types were placed in this encounter.   All questions were answered. The patient knows to call the clinic with any problems, questions or concerns.  A total of more than 30 minutes were spent on this encounter with face-to-face time and non-face-to-face time, including preparing to see the patient, ordering tests and/or medications, counseling the patient and coordination of care as outlined above.   Ulysees Barns, MD Department of Hematology/Oncology [facility] at [facility] Phone: [phone] Pager: [phone] Email: [email or web address]  03/22/2023 1:01 PM

## 2023-03-24 ENCOUNTER — Ambulatory Visit: Payer: 59 | Admitting: Obstetrics and Gynecology

## 2023-03-29 ENCOUNTER — Other Ambulatory Visit (HOSPITAL_COMMUNITY)
Admission: RE | Admit: 2023-03-29 | Discharge: 2023-03-29 | Disposition: A | Discharge status: Home / Self Care | Payer: 59 | Source: Ambulatory Visit | Attending: Obstetrics and Gynecology | Admitting: Obstetrics and Gynecology

## 2023-03-29 ENCOUNTER — Encounter: Payer: Self-pay | Admitting: Advanced Practice Midwife

## 2023-03-29 ENCOUNTER — Ambulatory Visit (INDEPENDENT_AMBULATORY_CARE_PROVIDER_SITE_OTHER): Payer: 59 | Admitting: Advanced Practice Midwife

## 2023-03-29 VITALS — BP 132/89 | HR 69 | Ht 65.0 in | Wt 229.0 lb

## 2023-03-29 DIAGNOSIS — Z01419 Encounter for gynecological examination (general) (routine) without abnormal findings: Secondary | ICD-10-CM

## 2023-03-29 DIAGNOSIS — Z124 Encounter for screening for malignant neoplasm of cervix: Secondary | ICD-10-CM | POA: Insufficient documentation

## 2023-03-29 DIAGNOSIS — Z3041 Encounter for surveillance of contraceptive pills: Secondary | ICD-10-CM

## 2023-03-29 DIAGNOSIS — Z113 Encounter for screening for infections with a predominantly sexual mode of transmission: Secondary | ICD-10-CM | POA: Insufficient documentation

## 2023-03-29 DIAGNOSIS — N6322 Unspecified lump in the left breast, upper inner quadrant: Secondary | ICD-10-CM

## 2023-03-29 DIAGNOSIS — N920 Excessive and frequent menstruation with regular cycle: Secondary | ICD-10-CM | POA: Diagnosis not present

## 2023-03-29 MED ORDER — NEXTSTELLIS 3-14.2 MG PO TABS: 1.0000 | ORAL_TABLET | Freq: Every day | ORAL | 4 refills | Status: AC

## 2023-03-29 NOTE — Progress Notes (Signed)
Subjective:     Tina Spence is a 22 y.o. female here at [facility] for a routine exam.  Current complaints: mass in left breast x 1 month, menses less often (30 day vs 25 day cycles) with Slynd but had mood changes with COCs.  Personal and family health history reviewed: yes.  Do you have a primary care provider? yes Do you feel safe at home? yes  Flowsheet Row Office Visit from 12/22/2022 in [facility]  PHQ-2 Total Score 2       Health Maintenance Due  Topic Date Due   DTaP/Tdap/Td (1 - Tdap) Never done   Cervical Cancer Screening (Pap smear)  Never done   HPV VACCINES (2 - 3-dose series) 02/12/2022   INFLUENZA VACCINE  Never done   COVID-19 Vaccine (1 - 2023-24 season) Never done     Risk factors for chronic health problems: Smoking: no Alchohol/how much: occasional Pt BMI: Body mass index is 38.11 kg/m.   Gynecologic History No LMP recorded. (Menstrual status: Oral contraceptives). Contraception: oral progesterone-only contraceptive Last Pap: n/a.  Last mammogram: n/a  Obstetric History OB History  Gravida Para Term Preterm AB Living  0 0 0 0 0 0  SAB IAB Ectopic Multiple Live Births  0 0 0 0 0     The following portions of the patient's history were reviewed and updated as appropriate: allergies, current medications, past family history, past medical history, past social history, past surgical history, and problem list.  Review of Systems Pertinent items noted in HPI and remainder of comprehensive ROS otherwise negative.    Objective:  BP 132/89   Pulse 69   Ht 5\' 5"  (1.651 m)   Wt 229 lb (103.9 kg)   BMI 38.11 kg/m   VS reviewed, nursing note reviewed,  Constitutional: well developed, well nourished, no distress HEENT: normocephalic, thyroid without enlargement or mass HEART: RRR, no murmurs rubs/gallops RESP: clear and equal to auscultation bilaterally in all lobes  Breast Exam: exam performed: right breast with  smooth round mobile mass, ~ 1 dm in diameter in upper inner quadrant, no skin or nipple changes or axillary nodes, left breast normal without mass, skin or nipple changes or axillary nodes Abdomen: soft Neuro: alert and oriented x 3 Skin: warm, dry Psych: affect normal Pelvic exam: Performed: Cervix pink, visually closed, without lesion, dark red bleeding, one fox swab used to visualize cervix during menses, vaginal walls and external genitalia normal Bimanual exam: Cervix 0/long/high, firm, anterior, neg CMT, uterus nontender, nonenlarged, adnexa without tenderness, enlargement, or mass        Assessment/Plan:   1. Encounter for annual routine gynecological examination   2. Cervical cancer screening  - Cytology - PAP( [place])  3. Routine screening for STI (sexually transmitted infection)  - Cervicovaginal ancillary only( ) - HIV antibody (with reflex) - RPR - Hepatitis B Surface AntiGEN - Hepatitis C Antibody  4. Mass of upper inner quadrant of left breast  - [place] LIMITED ULTRASOUND INCLUDING AXILLA LEFT BREAST ; Future   5. Encounter for surveillance of contraceptive pills --Pt taking Slynd since she had mood changes with COCs. Menses less often (30 days vs 25) but not lighter.  Discussed option of trying Nextstellis, with plant based estrogen, and studies showing less mood changes. BMI 38 but no other risk factors for COCs.  Pt opted to try Nexstellis, given hx menorrhagia with anemia.  - Drospirenone-Estetrol (NEXTSTELLIS) 3-14.2 MG TABS; Take 1 tablet  by mouth daily.  Dispense: 84 tablet; Refill: 4  6. Menorrhagia with regular cycle      Return in about 3 months (around 06/29/2023) for Gyn follow up for contraception, with me.   Sharen Counter, CNM 9:58 AM

## 2023-03-29 NOTE — Progress Notes (Signed)
Pt c/o a lump in the left breast for 1 month. Pt reports breast tenderness.   No PAP Hx

## 2023-03-30 LAB — HEPATITIS C ANTIBODY: Hep C Virus Ab: NONREACTIVE

## 2023-03-30 LAB — RPR: RPR Ser Ql: NONREACTIVE

## 2023-03-30 LAB — HIV ANTIBODY (ROUTINE TESTING W REFLEX): HIV Screen 4th Generation wRfx: NONREACTIVE

## 2023-03-30 LAB — HEPATITIS B SURFACE ANTIGEN: Hepatitis B Surface Ag: NEGATIVE

## 2023-03-31 LAB — CERVICOVAGINAL ANCILLARY ONLY
Chlamydia: NEGATIVE
Comment: NEGATIVE
Comment: NEGATIVE
Comment: NORMAL
Neisseria Gonorrhea: NEGATIVE
Trichomonas: NEGATIVE

## 2023-04-01 LAB — CYTOLOGY - PAP: Diagnosis: NEGATIVE

## 2023-04-09 ENCOUNTER — Ambulatory Visit
Admission: RE | Admit: 2023-04-09 | Discharge: 2023-04-09 | Disposition: A | Discharge status: Home / Self Care | Payer: 59 | Source: Ambulatory Visit | Attending: Advanced Practice Midwife | Admitting: Advanced Practice Midwife

## 2023-04-09 ENCOUNTER — Other Ambulatory Visit: Payer: Self-pay | Admitting: Advanced Practice Midwife

## 2023-04-09 DIAGNOSIS — N6322 Unspecified lump in the left breast, upper inner quadrant: Secondary | ICD-10-CM

## 2023-04-12 ENCOUNTER — Encounter: Payer: Self-pay | Admitting: Advanced Practice Midwife

## 2023-04-26 DIAGNOSIS — Z6837 Body mass index (BMI) 37.0-37.9, adult: Secondary | ICD-10-CM | POA: Diagnosis not present

## 2023-04-26 DIAGNOSIS — F419 Anxiety disorder, unspecified: Secondary | ICD-10-CM | POA: Diagnosis not present

## 2023-04-26 DIAGNOSIS — D573 Sickle-cell trait: Secondary | ICD-10-CM | POA: Diagnosis not present

## 2023-04-26 DIAGNOSIS — D509 Iron deficiency anemia, unspecified: Secondary | ICD-10-CM | POA: Diagnosis not present

## 2023-06-21 ENCOUNTER — Ambulatory Visit (INDEPENDENT_AMBULATORY_CARE_PROVIDER_SITE_OTHER): Payer: 59

## 2023-06-21 ENCOUNTER — Other Ambulatory Visit (HOSPITAL_COMMUNITY)
Admission: RE | Admit: 2023-06-21 | Discharge: 2023-06-21 | Disposition: A | Discharge status: Home / Self Care | Payer: 59 | Source: Ambulatory Visit

## 2023-06-21 ENCOUNTER — Ambulatory Visit
Admission: EM | Admit: 2023-06-21 | Discharge: 2023-06-21 | Disposition: A | Discharge status: Home / Self Care | Payer: 59 | Attending: Emergency Medicine | Admitting: Emergency Medicine

## 2023-06-21 VITALS — BP 136/90 | HR 80 | Wt 229.8 lb

## 2023-06-21 DIAGNOSIS — N760 Acute vaginitis: Secondary | ICD-10-CM | POA: Insufficient documentation

## 2023-06-21 DIAGNOSIS — B9689 Other specified bacterial agents as the cause of diseases classified elsewhere: Secondary | ICD-10-CM | POA: Insufficient documentation

## 2023-06-21 DIAGNOSIS — J029 Acute pharyngitis, unspecified: Secondary | ICD-10-CM | POA: Diagnosis not present

## 2023-06-21 DIAGNOSIS — J069 Acute upper respiratory infection, unspecified: Secondary | ICD-10-CM

## 2023-06-21 DIAGNOSIS — N898 Other specified noninflammatory disorders of vagina: Secondary | ICD-10-CM

## 2023-06-21 DIAGNOSIS — B3731 Acute candidiasis of vulva and vagina: Secondary | ICD-10-CM | POA: Insufficient documentation

## 2023-06-21 LAB — POCT RAPID STREP A (OFFICE): Rapid Strep A Screen: NEGATIVE

## 2023-06-21 MED ORDER — LIDOCAINE VISCOUS HCL 2 % MT SOLN
15.0000 mL | OROMUCOSAL | 0 refills | Status: AC | PRN
Start: 2023-06-21 — End: ?

## 2023-06-21 NOTE — Discharge Instructions (Addendum)
Ibuprofen can be alternated with tylenol every 4-6 hours Make sure you are drinking lots of fluids! Salt water gargles, lozenges, or throat spray Lidocaine can be used as gargle and spit to numb the throat Strep test today was negative. We will call you if anything returns on your throat culture (2-3 days)  For cough I recommend Delsym (dextromethorphan)

## 2023-06-21 NOTE — Progress Notes (Signed)
SUBJECTIVE:  23 y.o. female who desires a STI screen. Denies abnormal vaginal discharge, bleeding or significant pelvic pain. No UTI symptoms. Denies history of known exposure to STD. Pt complaints of itching.   LMP: 06/21/23 (just started on arrival)  OBJECTIVE:  She appears well.   ASSESSMENT:  STI Screen   PLAN:  Pt offered STI blood screening-declined GC, chlamydia, and trichomonas probe sent to lab.  Treatment: To be determined once lab results are received.  Pt follow up as needed.

## 2023-06-21 NOTE — ED Provider Notes (Signed)
[facility]    CSN: [id number] Arrival date & time: 06/21/23  0935      History   Chief Complaint Chief Complaint  Patient presents with   Sore Throat    HPI Tina Spence is a 23 y.o. female.  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  Past Medical History:  Diagnosis Date   Anxiety    Gastritis 08/2022   Sickle cell trait [facility])     Patient Active Problem List   Diagnosis Date Noted   Iron deficiency anemia, unspecified 11/25/2022   Sickle-cell trait (HCC) 01/15/2022   Tonsillar enlargement 01/15/2022   Recurrent tonsillitis 01/15/2022   Menorrhagia with regular cycle 01/15/2022   Encounter for general counseling and advice on contraceptive management 01/15/2022   Iron deficiency 03/04/2021    Past Surgical History:  Procedure Laterality Date   CYST REMOVAL NECK      OB History     Gravida  0   Para  0   Term  0   Preterm  0   AB  0   Living  0      SAB  0   IAB  0   Ectopic  0   Multiple  0   Live Births  0            Home Medications    Prior to Admission medications   Medication Sig Start Date End Date Taking? Authorizing Provider  lidocaine (XYLOCAINE) 2 % solution Use as directed 15 mLs in the mouth or throat every 3 (three) hours as needed for mouth pain. Swish/gargle and spit out 06/21/23  Yes Egan Sahlin, Lurena Joiner, PA-C  Drospirenone-Estetrol (NEXTSTELLIS) 3-14.2 MG TABS Take 1 tablet by mouth daily. 03/29/23   Leftwich-Kirby, Wilmer Floor, CNM  FEROSUL 325 (65 Fe) MG tablet Take 1 tablet (325 mg total) by mouth every other day. 12/22/22   Anders Simmonds, PA-C  PARoxetine (PAXIL) 10 MG tablet Take 1 tablet (10 mg total) by mouth daily. 10/06/22   Brock Bad, MD  Prenat-Fe Poly-Methfol-FA-DHA (VITAFOL ULTRA) 29-0.6-0.4-200 MG CAPS Take 1 capsule by mouth daily before  breakfast. 10/22/21   Brock Bad, MD    Family History Family History  Problem Relation Age of Onset   Anemia Mother    Sickle cell trait Mother    Stroke Father    Hypertension Father    Heart disease Father    Sickle cell trait Sister    Breast cancer Maternal Great-grandmother    Sickle cell anemia Other    Colon cancer Neg Hx    Esophageal cancer Neg Hx    Colon polyps Neg Hx    Pancreatic cancer Neg Hx    Stomach cancer Neg Hx     Social History Social History   Tobacco Use   Smoking status: Never   Smokeless tobacco: Never  Vaping Use   Vaping status: Never Used  Substance Use Topics   Alcohol use: Yes    Comment: occ   Drug use: Never     Allergies   Patient has no known allergies.   Review of Systems Review of Systems  Per HPI  Physical Exam Triage Vital Signs ED Triage Vitals  Encounter Vitals Group     BP 06/21/23 1031 (!) 143/103     Systolic BP Percentile --  Diastolic BP Percentile --      Pulse Rate 06/21/23 1031 70     Resp 06/21/23 1031 16     Temp 06/21/23 1031 98 F (36.7 C)     Temp Source 06/21/23 1031 Oral     SpO2 06/21/23 1031 98 %     Weight --      Height --      Head Circumference --      Peak Flow --      Pain Score 06/21/23 1033 7     Pain Loc --      Pain Education --      Exclude from Growth Chart --    No data found.  Updated Vital Signs BP (!) 145/105 (BP Location: Left Arm)   Pulse 70   Temp 98 F (36.7 C) (Oral)   Resp 16   LMP 06/21/2023 (Exact Date)   SpO2 98%    Physical Exam Vitals and nursing note reviewed.  Constitutional:      General: She is not in acute distress.    Appearance: She is not ill-appearing.  HENT:     Right Ear: Tympanic membrane and ear canal normal.     Left Ear: Tympanic membrane and ear canal normal.     Nose: No rhinorrhea.     Mouth/Throat:     Mouth: Mucous membranes are moist.     Pharynx: Oropharynx is clear. Posterior oropharyngeal erythema (mild)  present.     Tonsils: No tonsillar exudate or tonsillar abscesses. 2+ on the right. 2+ on the left.     Comments: Normal phonation, tolerating secretions  Eyes:     Conjunctiva/sclera: Conjunctivae normal.  Cardiovascular:     Rate and Rhythm: Normal rate and regular rhythm.     Pulses: Normal pulses.     Heart sounds: Normal heart sounds.  Pulmonary:     Effort: Pulmonary effort is normal.     Breath sounds: Normal breath sounds. No wheezing or rales.  Musculoskeletal:     Cervical back: Normal range of motion.  Lymphadenopathy:     Cervical: No cervical adenopathy.  Skin:    General: Skin is warm and dry.  Neurological:     Mental Status: She is alert and oriented to person, place, and time.     UC Treatments / Results  Labs (all labs ordered are listed, but only abnormal results are displayed) Labs Reviewed  CULTURE, GROUP A STREP [facility])  POCT RAPID STREP A (OFFICE)    EKG   Radiology No results found.  Procedures Procedures (including critical care time)  Medications Ordered in UC Medications - No data to display  Initial Impression / Assessment and Plan / UC Course  I have reviewed the triage vital signs and the nursing notes.  Pertinent labs & imaging results that were available during my care of the patient were reviewed by me and considered in my medical decision making (see chart for details).  Rapid strep negative, culture pending Afebrile in clinic, well appearing overall Elevated BP, she will follow with her PCP about this Discussed viral etiology and symptomatic care. OTC medications recommended. 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 Salt water gargles, lozenges, or throat spray  Lidocaine can be  used as gargle and spit to numb the throat Strep test today was negative. We will call you if anything returns on your throat culture (2-3 days)  For cough I recommend Delsym (dextromethorphan)     ED Prescriptions     Medication Sig Dispense Auth. Provider   lidocaine (XYLOCAINE) 2 % solution Use as directed 15 mLs in the mouth or throat every 3 (three) hours as needed for mouth pain. Swish/gargle and spit out 100 mL Shafer Swamy, Lurena Joiner, PA-C      PDMP not reviewed this encounter.   Marlow Baars, [place] 06/21/23 1156

## 2023-06-21 NOTE — ED Triage Notes (Signed)
Pt states sore throat for the past four days with fever on and off. States she has been taking dayquil and nyquil at home.

## 2023-06-22 ENCOUNTER — Other Ambulatory Visit: Payer: Self-pay | Admitting: Obstetrics and Gynecology

## 2023-06-22 ENCOUNTER — Encounter: Payer: Self-pay | Admitting: Obstetrics and Gynecology

## 2023-06-22 LAB — CERVICOVAGINAL ANCILLARY ONLY
Bacterial Vaginitis (gardnerella): POSITIVE — AB
Candida Glabrata: NEGATIVE
Candida Vaginitis: POSITIVE — AB
Chlamydia: NEGATIVE
Comment: NEGATIVE
Comment: NEGATIVE
Comment: NEGATIVE
Comment: NEGATIVE
Comment: NEGATIVE
Comment: NORMAL
Neisseria Gonorrhea: NEGATIVE
Trichomonas: NEGATIVE

## 2023-06-22 MED ORDER — METRONIDAZOLE 500 MG PO TABS: 500.0000 mg | ORAL_TABLET | Freq: Two times a day (BID) | ORAL | 0 refills | Status: AC

## 2023-06-22 MED ORDER — FLUCONAZOLE 150 MG PO TABS: 150.0000 mg | ORAL_TABLET | Freq: Once | ORAL | 0 refills | Status: AC

## 2023-06-24 LAB — CULTURE, GROUP A STREP (THRC)

## 2023-07-09 ENCOUNTER — Telehealth: Payer: Self-pay | Admitting: Critical Care Medicine

## 2023-07-09 NOTE — Telephone Encounter (Signed)
 Contacted pt to confirm appt

## 2023-07-12 ENCOUNTER — Other Ambulatory Visit: Payer: 59

## 2023-07-14 ENCOUNTER — Ambulatory Visit: Payer: 59 | Attending: Critical Care Medicine | Admitting: Physician Assistant

## 2023-07-14 VITALS — BP 123/83 | HR 78 | Temp 99.0°F | Wt 226.8 lb

## 2023-07-14 DIAGNOSIS — J0391 Acute recurrent tonsillitis, unspecified: Secondary | ICD-10-CM | POA: Diagnosis not present

## 2023-07-14 DIAGNOSIS — F32A Depression, unspecified: Secondary | ICD-10-CM

## 2023-07-14 DIAGNOSIS — F419 Anxiety disorder, unspecified: Secondary | ICD-10-CM

## 2023-07-14 DIAGNOSIS — J029 Acute pharyngitis, unspecified: Secondary | ICD-10-CM

## 2023-07-14 DIAGNOSIS — R5383 Other fatigue: Secondary | ICD-10-CM

## 2023-07-14 MED ORDER — SERTRALINE HCL 25 MG PO TABS: 25.0000 mg | ORAL_TABLET | Freq: Every day | ORAL | 3 refills | Status: DC

## 2023-07-14 NOTE — Progress Notes (Signed)
Patient ID: Tina Spence, female   DOB: 2000-11-04, 23 y.o.   MRN: [id number]   Tina Spence, is a 23 y.o. female  QMV:[id number]  MWU:[id number]  DOB - March 27, 2001  Chief Complaint  Patient presents with   Med Change Request       Subjective:   Tina Spence is a 23 y.o. female here today 2-3 week h/o on and off ST with some tonsillar exudate.  Some low grade temps.  No abdominal pain.  No N/V.  In college.  Some low grade temps.  More tired than normal.  Neg strep test recently.  Paxil is causing nausea but helps great with anxiety/depression.  Wants to try something different.  No SI/HI.  BP was elevated when she was acutely ill.  Today BP is normal  No problems updated.  ALLERGIES: No Known Allergies  PAST MEDICAL HISTORY: Past Medical History:  Diagnosis Date   Anxiety    Gastritis 08/2022   Sickle cell trait (HCC)     MEDICATIONS AT HOME: Prior to Admission medications   Medication Sig Start Date End Date Taking? Authorizing Provider  sertraline (ZOLOFT) 25 MG tablet Take 1 tablet (25 mg total) by mouth daily. 07/14/23  Yes Janeah Kovacich M, PA-C  Drospirenone-Estetrol (NEXTSTELLIS) 3-14.2 MG TABS Take 1 tablet by mouth daily. 03/29/23   Leftwich-Kirby, Wilmer Floor, CNM  FEROSUL 325 (65 Fe) MG tablet Take 1 tablet (325 mg total) by mouth every other day. 12/22/22   Anders Simmonds, PA-C  lidocaine (XYLOCAINE) 2 % solution Use as directed 15 mLs in the mouth or throat every 3 (three) hours as needed for mouth pain. Swish/gargle and spit out 06/21/23   Rising, Lurena Joiner, PA-C  metroNIDAZOLE (FLAGYL) 500 MG tablet Take 1 tablet (500 mg total) by mouth 2 (two) times daily. 06/22/23   Constant, Peggy, MD  Prenat-Fe Poly-Methfol-FA-DHA (VITAFOL ULTRA) 29-0.6-0.4-200 MG CAPS Take 1 capsule by mouth daily before breakfast. 10/22/21   Brock Bad, MD    ROS: Neg resp Neg cardiac Neg GI Neg GU Neg MS Neg psych Neg neuro  Objective:   Vitals:   07/14/23 0929  BP:  123/83  Pulse: 78  Temp: 99 F (37.2 C)  SpO2: 99%  Weight: 226 lb 12.8 oz (102.9 kg)   Exam General appearance : Awake, alert, not in any distress. Speech Clear. Not toxic looking HEENT: Atraumatic and Normocephalic, pupils equally reactive to light and accomodation.  Throat with large tonsils and there is scant tonsillar exudate that is stringy and whitish No posterior occipital nodes Neck: Supple, no JVD. No cervical lymphadenopathy.  Chest: Good air entry bilaterally, CTAB.  No rales/rhonchi/wheezing CVS: S1 S2 regular, no murmurs.  Abdomen: Bowel sounds present, Non tender and not distended with no gaurding, rigidity or rebound. No hepatosplenomegaly Extremities: B/L Lower Ext shows no edema, both legs are warm to touch Neurology: Awake alert, and oriented X 3, CN II-XII intact, Non focal Skin: No Rash  Data Review Lab Results  Component Value Date   HGBA1C 5.6 12/22/2022   HGBA1C 5.4 01/15/2022    Assessment & Plan   1. Pharyngitis, unspecified etiology (Primary) Fluids, rest, salt water gargles - EPSTEIN-BARR VIRUS (EBV) Antibody Profile - CMV abs, IgG+IgM (cytomegalovirus) - CBC with Differential - Comprehensive metabolic panel  2. Recurrent tonsillitis See#1 - EPSTEIN-BARR VIRUS (EBV) Antibody Profile - CMV abs, IgG+IgM (cytomegalovirus) - CBC with Differential - Comprehensive metabolic panel  3. Other fatigue Likely related to #1 - Comprehensive metabolic panel  4. Anxiety and depression See AVS-starting very slowly to see if we can minimize nausea.   - sertraline (ZOLOFT) 25 MG tablet; Take 1 tablet (25 mg total) by mouth daily.  Dispense: 30 tablet; Refill: 3    Return in about 6 weeks (around 08/25/2023) for please assign to one of our PCP here to recheck dperession/anxiety.  The patient was given clear instructions to go to ER or return to medical center if symptoms don't improve, worsen or new problems develop. The patient verbalized understanding.  The patient was told to call to get lab results if they haven't heard anything in the next week.      Georgian Co, PA-C [facility] [place], [place] [phone]   07/14/2023, 9:51 AM

## 2023-07-14 NOTE — Patient Instructions (Signed)
Take 1/2 sertraline every other day for 1 week.  Then 1/2 tablet daily for a week.  You may keep it at this dose if the anxiety improves, otherwise increase to a whole tablet after a week-10 days on the 1/2 tab  Infectious Mononucleosis Infectious mononucleosis is also called mono. It's an infection that can affect many areas of your body, such as your throat, lymph glands, liver, and spleen. Mono is contagious. This means it spreads from person to person. In most cases, it goes away on its own in 2-4 weeks. In rare cases, mono can get worse and last longer. What are the causes? Mono is often caused by the Epstein-Barr virus (EBV). You can get the virus by: Coming in contact with the saliva or other body fluids of a person who has mono. This can happen through: Kissing. Sex. Coughing. Sneezing. Sharing forks, knives, spoons, or cups with a person who has mono. Receiving blood from a person who has mono. Getting an organ from a person who has mono. What increases the risk? You are more likely to get mono if you're 45-51 years old. What are the signs or symptoms?  Symptoms of mono often show up about 4-6 weeks after you're infected. Common symptoms include: Sore throat. Headache. Feeling very tired. Muscle aches. Swollen glands. Fever. Not wanting to eat as much as normal. Rash. Other symptoms include: A liver or spleen that's larger than normal. Nausea. Vomiting. Pain in your belly. How is this diagnosed? Mono may be diagnosed based on your medical history, symptoms, and a physical exam. Your health care provider may do blood tests to see if you have mono. How is this treated? There's no cure for mono. But in most cases, it goes away on its own with time. Treatment can help you feel better while you heal. It may include: Drinking lots of fluids. Getting lots of rest. Taking medicines. This may include medicines to treat swelling. If your symptoms are very bad, you may need to  be treated in a hospital. Follow these instructions at home: Medicines Take your medicines only as told by your provider. Do not take the antibiotics ampicillin or amoxicillin. They may cause a rash. If you're under 18, do not take aspirin. Aspirin is linked to Reye's syndrome in children. Activity Rest as needed. Do not do these things until your provider says they're safe for you: Contact sports. You may need to wait at least 1 month before you play sports. Exercises that take a lot of energy. Heavy lifting. Slowly go back to your normal activities after your fever is gone, or when your provider says you can. Be sure to rest when you get tired. General instructions Do not kiss other people until your provider says you can. Do not share forks, spoons, knives, or cups with other people until your provider says you can. Drink enough fluid to keep your pee pale yellow. Do not drink alcohol. If you have a sore throat: Swish and gargle with salt water and then spit it out. To make salt water, add -1 tsp (3-6 g) of salt to 1 cup (237 mL) of warm water. Mix well. Eat soft foods. Cold foods like ice cream or ice pops can soothe a sore throat. Try sucking on hard candy. How is this prevented?  Stay away from people who have mono. Even if the person doesn't feel sick, they can still spread the virus. Try not to share forks, spoons, knives, cups, or toothbrushes with  others. Wash your hands often with soap and water for at least 20 seconds. If you can't use soap and water, use hand sanitizer. Use the inside of your elbow to cover your mouth when you cough or sneeze. Where to find more information Centers for Disease Control and Prevention (CDC): [employer].no Contact a health care provider if: Your fever isn't gone after 10 days. You have swollen lymph glands that aren't back to normal after 4 weeks. Your activity level isn't back to normal after 2 months. Your skin or the white parts of your eyes  turn yellow. This is a condition called jaundice. You have trouble pooping, or constipation. You may: Poop fewer times in a week than normal. Have poop that is dry, hard, or bigger than normal. Get help right away if: You can't stop vomiting. You're drooling or you have trouble swallowing. You're dehydrated. This is when you don't have enough water in your body. Signs may include: Weakness. Pale skin. Sunken eyes or dry mouth. Fast breathing or heartbeat. You have trouble breathing. You have a stiff neck or a very bad headache. You have very bad pain in your belly or shoulder. You are confused or have trouble with balance. You have a seizure. Your nose or gums start to bleed. These symptoms may be an emergency. Call 911 right away. Do not wait to see if the symptoms will go away. Do not drive yourself to the hospital. This information is not intended to replace advice given to you by your health care provider. Make sure you discuss any questions you have with your health care provider. Document Revised: 02/18/2023 Document Reviewed: 06/26/2022 Elsevier Patient Education  2024 [employer].

## 2023-07-15 ENCOUNTER — Encounter: Payer: Self-pay | Admitting: Physician Assistant

## 2023-07-15 ENCOUNTER — Telehealth: Payer: Self-pay

## 2023-07-15 LAB — EPSTEIN-BARR VIRUS (EBV) ANTIBODY PROFILE
EBV NA IgG: 44.3 U/mL — ABNORMAL HIGH (ref 0.0–17.9)
EBV VCA IgG: 200 U/mL — ABNORMAL HIGH (ref 0.0–17.9)
EBV VCA IgM: <36 U/mL (ref 0.0–35.9)

## 2023-07-15 LAB — COMPREHENSIVE METABOLIC PANEL
ALT: 15 [IU]/L (ref 0–32)
AST: 15 [IU]/L (ref 0–40)
Albumin: 4.1 g/dL (ref 4.0–5.0)
Alkaline Phosphatase: 77 [IU]/L (ref 44–121)
BUN/Creatinine Ratio: 8 — ABNORMAL LOW (ref 9–23)
BUN: 8 mg/dL (ref 6–20)
Bilirubin Total: 0.4 mg/dL (ref 0.0–1.2)
CO2: 20 mmol/L (ref 20–29)
Calcium: 9.6 mg/dL (ref 8.7–10.2)
Chloride: 103 mmol/L (ref 96–106)
Creatinine, Ser: 0.97 mg/dL (ref 0.57–1.00)
Globulin, Total: 2.7 g/dL (ref 1.5–4.5)
Glucose: 90 mg/dL (ref 70–99)
Potassium: 4.3 mmol/L (ref 3.5–5.2)
Sodium: 138 mmol/L (ref 134–144)
Total Protein: 6.8 g/dL (ref 6.0–8.5)
eGFR: 85 mL/min/{1.73_m2} (ref >59)

## 2023-07-15 LAB — CBC WITH DIFFERENTIAL/PLATELET
Basophils Absolute: 0 10*3/uL (ref 0.0–0.2)
Basos: 1 %
EOS (ABSOLUTE): 0.1 10*3/uL (ref 0.0–0.4)
Eos: 3 %
Hematocrit: 42.2 % (ref 34.0–46.6)
Hemoglobin: 13.7 g/dL (ref 11.1–15.9)
Immature Grans (Abs): 0 10*3/uL (ref 0.0–0.1)
Immature Granulocytes: 0 %
Lymphocytes Absolute: 1.8 10*3/uL (ref 0.7–3.1)
Lymphs: 41 %
MCH: 26.9 pg (ref 26.6–33.0)
MCHC: 32.5 g/dL (ref 31.5–35.7)
MCV: 83 fL (ref 79–97)
Monocytes Absolute: 0.4 10*3/uL (ref 0.1–0.9)
Monocytes: 10 %
Neutrophils Absolute: 1.9 10*3/uL (ref 1.4–7.0)
Neutrophils: 45 %
Platelets: 358 10*3/uL (ref 150–450)
RBC: 5.09 x10E6/uL (ref 3.77–5.28)
RDW: 13.5 % (ref 11.7–15.4)
WBC: 4.3 10*3/uL (ref 3.4–10.8)

## 2023-07-15 LAB — CMV ABS, IGG+IGM (CYTOMEGALOVIRUS)
CMV Ab - IgG: <0.6 U/mL (ref 0.00–0.59)
CMV IgM Ser EIA-aCnc: <30 [AU]/ml (ref 0.0–29.9)

## 2023-07-15 NOTE — Telephone Encounter (Signed)
-----   Message from McAllister sent at 07/15/2023 12:08 PM EST ----- Blood sugar, kidney function, liver function, and electrolytes normal.  Cytomegalovirus is negative.  Awaiting mono results. Thanks, Georgian Co, PA-C

## 2023-07-15 NOTE — Telephone Encounter (Signed)
Patient viewed results and Doctor comment through [facility]

## 2023-07-16 ENCOUNTER — Telehealth: Payer: Self-pay

## 2023-07-16 NOTE — Telephone Encounter (Signed)
Pt was called and is aware of results, DOB was confirmed.  ?

## 2023-07-16 NOTE — Telephone Encounter (Signed)
Patient viewed results and Doctor comment through [facility]

## 2023-07-16 NOTE — Telephone Encounter (Signed)
-----   Message from Georgian Co sent at 07/15/2023  5:31 PM EST ----- Labs do show suspicion of recent/previous mono.  Fluids and rest as needed.  If symptoms worsen or abdominal pain develops go to an Urgent Care.  Your symptoms should wax and wane over the next few weeks and you will continue to feel better.  Thanks, Georgian Co, PA-C

## 2023-08-05 ENCOUNTER — Other Ambulatory Visit: Payer: Self-pay | Admitting: Physician Assistant

## 2023-08-05 DIAGNOSIS — F419 Anxiety disorder, unspecified: Secondary | ICD-10-CM

## 2023-08-24 ENCOUNTER — Other Ambulatory Visit: Payer: Self-pay | Admitting: Family Medicine

## 2023-08-24 DIAGNOSIS — F32A Depression, unspecified: Secondary | ICD-10-CM

## 2023-09-08 ENCOUNTER — Ambulatory Visit: Payer: 59 | Admitting: Family Medicine

## 2023-09-20 ENCOUNTER — Ambulatory Visit: Admitting: Family Medicine
